# Patient Record
Sex: Female | Born: 1975
Health system: Southern US, Community
[De-identification: ages and names within clinical notes are randomized; demographics above are authoritative.]

## PROBLEM LIST (undated history)

## (undated) ENCOUNTER — Inpatient Hospital Stay (HOSPITAL_COMMUNITY): Payer: Self-pay

## (undated) DIAGNOSIS — O09529 Supervision of elderly multigravida, unspecified trimester: Secondary | ICD-10-CM

## (undated) DIAGNOSIS — R87619 Unspecified abnormal cytological findings in specimens from cervix uteri: Secondary | ICD-10-CM

## (undated) DIAGNOSIS — R87629 Unspecified abnormal cytological findings in specimens from vagina: Secondary | ICD-10-CM

## (undated) DIAGNOSIS — O24410 Gestational diabetes mellitus in pregnancy, diet controlled: Secondary | ICD-10-CM

## (undated) DIAGNOSIS — O99119 Other diseases of the blood and blood-forming organs and certain disorders involving the immune mechanism complicating pregnancy, unspecified trimester: Secondary | ICD-10-CM

## (undated) DIAGNOSIS — R011 Cardiac murmur, unspecified: Secondary | ICD-10-CM

## (undated) DIAGNOSIS — D696 Thrombocytopenia, unspecified: Secondary | ICD-10-CM

## (undated) DIAGNOSIS — R51 Headache: Secondary | ICD-10-CM

## (undated) DIAGNOSIS — R002 Palpitations: Secondary | ICD-10-CM

## (undated) DIAGNOSIS — R519 Headache, unspecified: Secondary | ICD-10-CM

## (undated) DIAGNOSIS — IMO0002 Reserved for concepts with insufficient information to code with codable children: Secondary | ICD-10-CM

## (undated) DIAGNOSIS — O24419 Gestational diabetes mellitus in pregnancy, unspecified control: Secondary | ICD-10-CM

## (undated) DIAGNOSIS — G8929 Other chronic pain: Secondary | ICD-10-CM

## (undated) DIAGNOSIS — Z3169 Encounter for other general counseling and advice on procreation: Secondary | ICD-10-CM

## (undated) DIAGNOSIS — O30043 Twin pregnancy, dichorionic/diamniotic, third trimester: Secondary | ICD-10-CM

## (undated) HISTORY — DX: Unspecified abnormal cytological findings in specimens from vagina: R87.629

## (undated) HISTORY — DX: Other diseases of the blood and blood-forming organs and certain disorders involving the immune mechanism complicating pregnancy, unspecified trimester: O99.119

## (undated) HISTORY — PX: OTHER SURGICAL HISTORY: SHX169

## (undated) HISTORY — DX: Gestational diabetes mellitus in pregnancy, unspecified control: O24.419

## (undated) HISTORY — PX: HYSTEROSCOPY: SHX211

## (undated) HISTORY — PX: WISDOM TOOTH EXTRACTION: SHX21

## (undated) HISTORY — DX: Other diseases of the blood and blood-forming organs and certain disorders involving the immune mechanism complicating pregnancy, unspecified trimester: D69.6

## (undated) HISTORY — DX: Palpitations: R00.2

## (undated) HISTORY — DX: Reserved for concepts with insufficient information to code with codable children: IMO0002

## (undated) HISTORY — DX: Supervision of elderly multigravida, unspecified trimester: O09.529

## (undated) HISTORY — DX: Unspecified abnormal cytological findings in specimens from cervix uteri: R87.619

## (undated) HISTORY — DX: Cardiac murmur, unspecified: R01.1

## (undated) HISTORY — DX: Encounter for other general counseling and advice on procreation: Z31.69

---

## 2007-09-24 ENCOUNTER — Emergency Department (HOSPITAL_COMMUNITY): Admission: EM | Admit: 2007-09-24 | Discharge: 2007-09-24 | Payer: Self-pay | Admitting: Emergency Medicine

## 2007-09-26 HISTORY — PX: COLPOSCOPY: SHX161

## 2008-02-20 ENCOUNTER — Encounter: Payer: Self-pay | Admitting: Family Medicine

## 2008-02-20 ENCOUNTER — Ambulatory Visit: Payer: Self-pay | Admitting: Family Medicine

## 2008-03-03 ENCOUNTER — Ambulatory Visit: Payer: Self-pay | Admitting: Family Medicine

## 2008-03-11 ENCOUNTER — Ambulatory Visit (HOSPITAL_COMMUNITY): Admission: RE | Admit: 2008-03-11 | Discharge: 2008-03-11 | Payer: Self-pay | Admitting: Obstetrics & Gynecology

## 2008-03-24 ENCOUNTER — Ambulatory Visit: Payer: Self-pay | Admitting: Family Medicine

## 2008-04-16 ENCOUNTER — Ambulatory Visit: Payer: Self-pay | Admitting: Nurse Practitioner

## 2009-02-16 ENCOUNTER — Ambulatory Visit: Payer: Self-pay | Admitting: Family Medicine

## 2009-02-16 DIAGNOSIS — S335XXA Sprain of ligaments of lumbar spine, initial encounter: Secondary | ICD-10-CM

## 2009-02-16 DIAGNOSIS — J309 Allergic rhinitis, unspecified: Secondary | ICD-10-CM | POA: Insufficient documentation

## 2009-02-16 DIAGNOSIS — R87619 Unspecified abnormal cytological findings in specimens from cervix uteri: Secondary | ICD-10-CM

## 2009-02-16 DIAGNOSIS — G43109 Migraine with aura, not intractable, without status migrainosus: Secondary | ICD-10-CM

## 2009-02-16 DIAGNOSIS — Z91010 Allergy to peanuts: Secondary | ICD-10-CM

## 2009-03-05 ENCOUNTER — Encounter (INDEPENDENT_AMBULATORY_CARE_PROVIDER_SITE_OTHER): Payer: Self-pay | Admitting: *Deleted

## 2009-03-05 ENCOUNTER — Ambulatory Visit: Payer: Self-pay | Admitting: Family Medicine

## 2009-03-08 LAB — CONVERTED CEMR LAB
AST: 19 units/L (ref 0–37)
Alkaline Phosphatase: 61 units/L (ref 39–117)
Bilirubin, Direct: 0.1 mg/dL (ref 0.0–0.3)
CO2: 27 meq/L (ref 19–32)
Calcium: 10.1 mg/dL (ref 8.4–10.5)
Chloride: 111 meq/L (ref 96–112)
HDL: 58.4 mg/dL (ref >39.00)
Potassium: 4.5 meq/L (ref 3.5–5.1)
Sodium: 140 meq/L (ref 135–145)
Total CHOL/HDL Ratio: 3
Total Protein: 7 g/dL (ref 6.0–8.3)

## 2009-05-19 ENCOUNTER — Ambulatory Visit: Payer: Self-pay | Admitting: Family Medicine

## 2009-05-19 DIAGNOSIS — S93409A Sprain of unspecified ligament of unspecified ankle, initial encounter: Secondary | ICD-10-CM | POA: Insufficient documentation

## 2009-06-03 ENCOUNTER — Ambulatory Visit: Payer: Self-pay | Admitting: Family Medicine

## 2009-06-08 ENCOUNTER — Ambulatory Visit: Payer: Self-pay | Admitting: Family Medicine

## 2009-07-12 ENCOUNTER — Ambulatory Visit: Payer: Self-pay | Admitting: Family Medicine

## 2009-08-24 ENCOUNTER — Telehealth: Payer: Self-pay | Admitting: Family Medicine

## 2009-09-21 ENCOUNTER — Ambulatory Visit: Payer: Self-pay | Admitting: Family Medicine

## 2009-09-21 DIAGNOSIS — B9789 Other viral agents as the cause of diseases classified elsewhere: Secondary | ICD-10-CM | POA: Insufficient documentation

## 2010-11-23 ENCOUNTER — Encounter (HOSPITAL_COMMUNITY)
Admission: RE | Admit: 2010-11-23 | Discharge: 2010-11-23 | Disposition: A | Discharge status: Home / Self Care | Payer: 59 | Source: Ambulatory Visit | Attending: Obstetrics and Gynecology | Admitting: Obstetrics and Gynecology

## 2010-11-23 LAB — CBC
Hemoglobin: 14.8 g/dL (ref 12.0–15.0)
MCH: 31 pg (ref 26.0–34.0)
MCHC: 34.6 g/dL (ref 30.0–36.0)

## 2010-11-25 ENCOUNTER — Ambulatory Visit (HOSPITAL_COMMUNITY)
Admission: RE | Admit: 2010-11-25 | Discharge: 2010-11-25 | Disposition: A | Discharge status: Home / Self Care | Payer: 59 | Source: Ambulatory Visit | Attending: Obstetrics and Gynecology | Admitting: Obstetrics and Gynecology

## 2010-11-25 ENCOUNTER — Other Ambulatory Visit: Payer: Self-pay | Admitting: Obstetrics and Gynecology

## 2010-11-25 DIAGNOSIS — R9389 Abnormal findings on diagnostic imaging of other specified body structures: Secondary | ICD-10-CM | POA: Insufficient documentation

## 2010-11-25 DIAGNOSIS — Z01812 Encounter for preprocedural laboratory examination: Secondary | ICD-10-CM | POA: Insufficient documentation

## 2010-11-25 DIAGNOSIS — N840 Polyp of corpus uteri: Secondary | ICD-10-CM | POA: Insufficient documentation

## 2010-11-25 LAB — PREGNANCY, URINE: Preg Test, Ur: NEGATIVE

## 2010-12-06 NOTE — Op Note (Signed)
  [name], [name]           ACCOUNT NO.:  [id number]  MEDICAL RECORD NO.:  [id number]           PATIENT TYPE:  O  LOCATION:  [facility]                          [facility]  PHYSICIAN:  Sherron Monday, MD        DATE OF BIRTH:  1976/06/03  DATE OF PROCEDURE:  11/25/2010 DATE OF DISCHARGE:                              OPERATIVE REPORT   PREOPERATIVE DIAGNOSIS:  Endometrial polyp on ultrasound.  POSTOPERATIVE DIAGNOSIS:  Endometrial polyp on ultrasound.  PROCEDURE:  Operative hysteroscopy, endometrial thickening/polyp in the anterior fundus.  SURGEON:  Sherron Monday, MD  ANESTHESIA:  General.  FINDINGS:  Small uterine cavity, normal bilateral ostia, endometrial thickening on the anterior aspect of the fundus.  SPECIMEN:  Endometrial polyp to pathology.  ESTIMATED BLOOD LOSS:  Minimal.  IV FLUIDS:  2150 mL.  URINE OUTPUT:  I and O cath at the start of the procedure.  COMPLICATIONS:  None.  DISPOSITION:  Stable to PACU. PROCEDURE:  After informed consent was reviewed with the patient including risks, benefits, and alternatives of the surgical procedure, she was transported to the OR, placed on the table in supine position. Anesthesia was induced and found to be adequate.  She was then placed in the [place] stirrups, prepped and draped in the normal sterile fashion.  Her bladder was sterilely drained.  Using an open-sided speculum, her cervix was easily visualized.  She was given a 16 mL 2% lidocaine paracervical block.  The cervix was dilated to accommodate the hysteroscope to approximately 21-French.  As the cervix was anterior, there was some difficulty to get into the cavity, but the cavity was inspected, normal bilateral ostia was seen, a small area of endometrial thickening likely polyp at the anterior uterine fundus was noted.  This was removed with graspers, otherwise the surgery was performed without complication.  The hysteroscope was removed and the tenaculum  was removed from the cervix, it was noted to be hemostatic.  The deficit was noted to be approximately 200 mL.  The patient tolerated the procedure well, was awakened in stable condition, transferred to the PACU.     Sherron Monday, MD     JB/MEDQ  D:  11/25/2010  T:  11/25/2010  Job:  [id number]  Electronically Signed by Sherron Monday MD on 12/06/2010 01:46:55 PM

## 2010-12-06 NOTE — H&P (Signed)
  [name], [name]           ACCOUNT NO.:  [id number]  MEDICAL RECORD NO.:  [id number]         PATIENT TYPE:  WAMB  LOCATION:                                [facility]  PHYSICIAN:  Sherron Monday, MD        DATE OF BIRTH:  December 17, 1975  DATE OF ADMISSION:  11/25/2010 DATE OF DISCHARGE:                             HISTORY & PHYSICAL   ADMISSION DIAGNOSIS:  Endometrial polyp on ultrasound.  PROCEDURE PLANNED:  Hysteroscopy, polypectomy, possible dilation and curettage.  HISTORY AND PHYSICAL:  A 35 year old G1, P0 with subfertility x3 years. She has had a normal HSG on an ultrasound during her workup.  It was noted that she had approximately a 1 cm x 1 cm fundally at the anterior wall.  She had normal bilateral ovaries.  She has had a lab evaluation to find causes of her infertility which was not diagnostic.  She also has had a progesterone evaluation after a cycle which revealed that she ovulated.  Her husband has a son from a previous relationship.  We recommended a semen analysis.  PAST MEDICAL HISTORY:  Significant for anxiety and migraines.  PAST SURGICAL HISTORY:  Not significant.  OB/GYN HISTORY:  She is a G1, P0-0-1-0 with G1 being a miscarriage.  She has a history of an abnormal Pap smear, this year was normal with high- risk HPV negative.  No sexually transmitted diseases.  She is sexually active and monogamous.  Cycles are fairly regular every 30 days.  No dyspareunia.  MEDICATIONS:  Singulair, Nasonex.  ALLERGIES:  SULFUR, FLOXIN, ZYRTEC and TREE NUTS.  SOCIAL HISTORY:  No tobacco, occasional alcohol, no drug use.  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  FAMILY HISTORY:  Significant for a sister and mother with thyroid disease, also mother has hypertension.  She has a aunt with breast cancer and an aunt with leukemia.  Grandmother with congestive heart failure.  PHYSICAL EXAMINATION:  VITAL SIGNS:  She is 5' 2, weighs 170 pounds, afebrile.  Vital signs  stable. GENERAL:  No apparent distress. CARDIOVASCULAR:  Regular rate and rhythm. LUNGS:  Clear to auscultation bilaterally. ABDOMEN:  Soft, nontender, nondistended.  Positive bowel sounds. EXTREMITIES:  Symmetric and nontender. PELVIC:  Exam reveals normal external female genitalia.  Normal Bartholin, urethral, and Skene glands.  Cervix and vagina are without lesions.  No cervical motion tenderness.  Uterus and adnexa are within normal limits.  No masses and nontender.  ASSESSMENT AND PLAN:  A 35 year old G1, P 0-0-1-0 for hysteroscopy, polypectomy.  Discussed with the patient and her husband risks, benefits, and alternatives of the procedure including, but not limited to bleeding, infection, damage to surrounding organs as well as causing scar tissue.  She voices understanding and wishes to proceed.     Sherron Monday, MD     JB/MEDQ  D:  11/24/2010  T:  11/24/2010  Job:  [id number]  Electronically Signed by Sherron Monday MD on 12/06/2010 01:46:22 PM

## 2011-02-07 NOTE — Group Therapy Note (Signed)
NAME:  [name], [name] NO.:  [id number]   MEDICAL RECORD NO.:  [id number]          PATIENT TYPE:  WOC   LOCATION:  [facility]                   FACILITY:  [facility]   PHYSICIAN:  Tinnie Gens, MD        DATE OF BIRTH:  02/13/1976   DATE OF SERVICE:                                  CLINIC NOTE   CHIEF COMPLAINT:  Yearly exam.   HISTORY OF PRESENT ILLNESS:  The patient is a questionably gravida 1,  para 0, who comes in today for annual exam.  She is without significant  complaint.  She is currently on no birth control, but she and her  husband live bicoastally.  They see each other about every 6 weeks.  She  has been on no birth control for the past 3 years, but they really have  not been together actively every month since they initially got married.  She has regular cycles every 31 to 38 days and no significant pain with  her period.   PAST MEDICAL HISTORY:  Significant for migraine headache.   PAST SURGICAL HISTORY:  Negative.   MEDICATIONS:  Maxalt p.r.n. migraines, Tylenol 1 to 3 p.o. as needed.   ALLERGIES:  SULFA, FLOXIN AND ZYRTEC.  She is not allergic to latex.   OBSTETRICAL HISTORY:  She is G1, P0.  She never had a positive pregnancy  test but did have symptoms of pregnancy and some heavy bleeding.  This  was not clearly documented.   GYNECOLOGIC HISTORY:  Menarche at age 41.  Cycles are monthly.  They  last for 6 days with medium flow.  She has no history of abnormal Pap.  No history of GC chlamydia or PID.   FAMILY HISTORY:  Significant for coronary artery disease, hypertension,  breast cancer in an aunt, and a grandmother who had blood clots.   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  She denies tobacco use.  Social alcohol user, 2 to 3 glasses of wine per  week.  No other drug use.   REVIEW OF SYSTEMS:  Fourteen-point review of systems was reviewed.  Please see GYN history in the chart.  Positive for muscle aches,  fatigue, nausea, vomiting at times, especially with taking vitamins.   PHYSICAL EXAMINATION:  VITAL SIGNS:  Vitals are as noted on the chart.  She had a blood pressure initially of 150/92 but a repeat of 122/82.  Pulse was 108.  Weight was 175.  GENERAL:  She is a well-developed, well-nourished female in no acute  distress.  HEENT:  Normocephalic, atraumatic.  Sclerae are anicteric.  NECK:  Supple.  Normal thyroid.  LUNGS:  Clear bilaterally.  CARDIOVASCULAR:  Regular rate and rhythm without rubs, gallops or  murmurs.  ABDOMEN:  Soft, nontender, nondistended.  EXTREMITIES:  No clubbing, cyanosis or edema.  There are 2+  distal  pulses.  BREASTS:  Symmetric with everted nipples.  No masses.  The left breast  has a little bit of pulling in of the nipple.  This is not new or  unchanged for her.  There is no supraclavicular or  axillary adenopathy.  GENITOURINARY:  Normal external female genitalia.  BUS was normal.  Vagina was pink and rugated.  Cervix was nulliparous without lesion.  Uterus was small, anteverted.  No adnexal mass or tenderness.  SKIN:  She has 2 moles on the chest that are somewhat enlarged with  irregular borders and difference in pigmentation.  She reports these are  unchanged.   IMPRESSION:  1. Annual exam.  2. Question of infertility, although it is not clear to me.  She      clearly meets criteria since she and her husband have not been      together and trying actively for 1 year; however, she has not been      using birth control for 3 years and has no significant pregnancies      to show for it.  No workup has been done as yet.   PLAN:  1. Pap smear today.  2. Follow up infertility workup, to include HSG, check hormone levels,      check thyroid and rule out diabetes.  If she desires she will call      [place] back and let us know about this.  Husband should also undergo      semen analysis testing; however, if they get in the same city for      long enough she  might achieve pregnancy without any of this      intervention.           ______________________________  Tinnie Gens, MD     TP/MEDQ  D:  02/20/2008  T:  02/20/2008  Job:  [id number]

## 2011-02-07 NOTE — Assessment & Plan Note (Signed)
[name], [name]           ACCOUNT NO.:  [id number]   MEDICAL RECORD NO.:  [id number]          PATIENT TYPE:  POB   LOCATION:  [facility]         FACILITY:  [facility]   PHYSICIAN:  Ginger Carne, MD DATE OF BIRTH:  1975/12/10   DATE OF SERVICE:  04/16/2008                                  CLINIC NOTE   The patient comes to the office today for consultation on her migraine  headaches.  The patient does admit to having had migraine headaches  since she was 35 years old.  She has a mother, grandmother, and sister  that have had migraines.  She has been formerly diagnosed with migraine  in the past.  She does not have an aura with her headaches.  She states  that these are located in her bilateral temple as well as occipital  regions.  She does have nausea and vomiting if she does not take her  medicine and is the headache comes on too strongly or last too long.  She is currently using Maxalt 1-2 tablets per month and Excedrin  approximately 8 tablets per month.  She is currently having 0-1 severe  headaches per month, 2-3 moderate headaches per month, and 8 mild  headaches per month.  Her triggers are weather, stress, and allergies to  outside plants that seem to last through the spring and summer.  She has  been on multiple meds for her allergies.  She is currently taking  Claritin-D.  She has a questionable allergy to [facility].   ALLERGIES:  SULFA, FLOXIN, and  ZYRTEC.   PHYSICAL EXAMINATION:  GENERAL:  A well-developed, well-nourished 35-  year-old Caucasian female in no acute distress.  VITAL SIGNS:  Blood pressure is 134/80, pulse is 93, weight is 178, and  height is 5 feet 8 inches.  HEENT:  Head is normocephalic and atraumatic.  Pupils are equal and  react to light.  CARDIAC:  Regular rate and rhythm.  No murmurs, rubs, or thrills.  LUNGS:  Clear bilaterally without rales, rhonchi, or wheezes.  NEUROLOGIC:  The patient is alert and oriented.  Her speech is  fluid.  There is no flight of ideas.  She has good muscle coordination and  strength.  She has no obvious neurological deficits.   ASSESSMENT/PLAN:  Her plan is to continue to use her Maxalt.  We did  have an approximately 40-minute discussion on using her medications  aggressively. She is clearly not using her Maxalt enough.  She will  switch from Excedrin Migraine over to Aleve to use for her milder  headaches and to add with the Maxalt.  We will renew her prescription  for Maxalt 10 mg 1 p.o. p.r.n. migraine, #12, with p.r.n. refills.  She  was also given a prescription for Phenergan to take 25 mg 1/2-1 tablet  q.6 h. p.r.n. migraine, #30, with 1 refill.  We will also have her  referred to an allergist.  Hopefully, she will get on a better regime  for her allergies and this will help her tremendously.  She does have  plans on becoming pregnant.  She is aware that if she becomes pregnant  she cannot take the Maxalt and  she is asked to return to the office for  a new plan.   The patient is informed of the results of her colposcopy.  She is asked  to return in 6 months for repeat Pap smear.      Remonia Richter, NP    ______________________________  Ginger Carne, MD    LR/MEDQ  D:  04/16/2008  T:  04/17/2008  Job:  [id number]

## 2011-07-19 ENCOUNTER — Telehealth: Payer: Self-pay | Admitting: *Deleted

## 2011-07-19 NOTE — Telephone Encounter (Signed)
Pt states she is starting the process for IVF and her doctors at [facility] center  are concerned about her taking maxalt.  They are asking if ok for her to take this while going through the process and while pregnant.  Please advise.

## 2011-07-20 NOTE — Telephone Encounter (Signed)
Notify pt that.. While activty trying to get pregnant or when pregnant.. maxalt is not a good option for migraine treatment...have her make appt to discuss migraines and other options to treat if happening frequently or not controlled with OTC tylenol (she should not use ibuprofen, ASA, naprosyn)

## 2011-07-20 NOTE — Telephone Encounter (Signed)
Patient notified as instructed by telephone. Tylenol is not helping. Pt cannot come right away. Pt scheduled appt with Dr Ermalene Searing on 08/10/11 at  3:45pm. Pt will call back sooner if h/a worsens.

## 2011-08-09 ENCOUNTER — Encounter: Payer: Self-pay | Admitting: Family Medicine

## 2011-08-10 ENCOUNTER — Encounter: Payer: Self-pay | Admitting: Family Medicine

## 2011-08-10 ENCOUNTER — Ambulatory Visit (INDEPENDENT_AMBULATORY_CARE_PROVIDER_SITE_OTHER): Payer: 59 | Admitting: Family Medicine

## 2011-08-10 VITALS — BP 110/70 | HR 117 | Temp 98.4°F | Ht 67.0 in | Wt 174.1 lb

## 2011-08-10 DIAGNOSIS — G43009 Migraine without aura, not intractable, without status migrainosus: Secondary | ICD-10-CM

## 2011-08-10 MED ORDER — ACETAMINOPHEN-CODEINE #3 300-30 MG PO TABS: 1.0000 | ORAL_TABLET | ORAL | Status: AC | PRN

## 2011-08-10 NOTE — Patient Instructions (Addendum)
Make sure to avoid triggers. Get 8 hours of sleep a night, eat regularly, good amount of protein, push fluids. Tylenol for pain, but if not improving may try tylenol #3. Limit as much as possible. Call if not taking care of acute migraine. If significant nausea, we can try/add metoclopramide. Call if migraines occuring more than 1-2 a week.

## 2011-08-10 NOTE — Assessment & Plan Note (Addendum)
Occuring frequently... Discussed healthy lifestyle and trigger avoidance. Will use tylenol for pain med, but if not responding will try tylenol # 3. Discussed in detail with pt options , class C status of Tylenol #3 and other options. She understands risk. She will limit use as much as possible.

## 2011-08-10 NOTE — Progress Notes (Signed)
  Subjective:    Patient ID: [name], female    DOB: Apr 13, 1976, 35 y.o.   MRN: [id number]  HPI  35 year old female with history of migraines presents after completing cycle of IVF. She is here to discuss migraine treatment during possible pregnancy.  In past using... maxalt ofr excedrine, but these are contraindicated in pregnancy. Pain over right eye, neck tension, mild nausea. Tylenol does not help her. Has been having migraines (trigger allergies) worse in spring and fall...occuring 3-4 a month. She is on singulair  and rhinocort. Some association with menses as well, or change in weather, no food triggers, some stress or fatigue trigger.     Review of Systems  Constitutional: Negative for fever and fatigue.  HENT: Negative for ear pain.   Eyes: Negative for pain.  Respiratory: Negative for chest tightness and shortness of breath.   Cardiovascular: Negative for chest pain, palpitations and leg swelling.  Gastrointestinal: Negative for abdominal pain.  Genitourinary: Negative for dysuria.  Neurological: Negative for syncope, weakness and numbness.       Objective:   Physical Exam  Constitutional: She is oriented to person, place, and time. She appears well-developed and well-nourished.  Neck: Normal range of motion. Neck supple.  Cardiovascular: Normal rate, regular rhythm, normal heart sounds and intact distal pulses.   Pulmonary/Chest: Effort normal and breath sounds normal.  Abdominal: Soft. Bowel sounds are normal.  Neurological: She is alert and oriented to person, place, and time. She displays normal reflexes. No cranial nerve deficit. She exhibits normal muscle tone. Coordination normal.  Psychiatric: She has a normal mood and affect. Her behavior is normal. Judgment and thought content normal.          Assessment & Plan:

## 2011-09-12 LAB — OB RESULTS CONSOLE GC/CHLAMYDIA
Chlamydia: NEGATIVE
Gonorrhea: NEGATIVE

## 2011-09-12 LAB — OB RESULTS CONSOLE RUBELLA ANTIBODY, IGM: Rubella: IMMUNE

## 2011-09-12 LAB — OB RESULTS CONSOLE HIV ANTIBODY (ROUTINE TESTING): HIV: NONREACTIVE

## 2011-09-26 NOTE — L&D Delivery Note (Signed)
Delivery Note Pt pushed well about one hour.  Had recurrent variable decels but they recovered between contractions and baby had good variability. At 3:49 PM a viable female was delivered via Vaginal, Spontaneous Delivery (Presentation: ; Occiput Anterior).  APGAR: 9, 9; weight pending.   Placenta status: Intact, Spontaneous.  Cord: 3 vessels with the following complications: None.   Anesthesia: Epidural  Episiotomy: None Lacerations: 2nd degree;Perineal Suture Repair: 3.0 vicryl rapide Est. Blood Loss (mL): 400cc  Mom to postpartum.  Baby to nursery-stable.  Oliver Pila 04/17/2012, 4:56 PM

## 2011-10-13 ENCOUNTER — Other Ambulatory Visit: Payer: Self-pay

## 2011-10-18 ENCOUNTER — Other Ambulatory Visit (HOSPITAL_COMMUNITY): Payer: Self-pay | Admitting: Obstetrics and Gynecology

## 2011-10-18 DIAGNOSIS — O289 Unspecified abnormal findings on antenatal screening of mother: Secondary | ICD-10-CM

## 2011-11-08 ENCOUNTER — Ambulatory Visit (HOSPITAL_COMMUNITY)
Admission: RE | Admit: 2011-11-08 | Discharge: 2011-11-08 | Disposition: A | Discharge status: Home / Self Care | Payer: 59 | Source: Ambulatory Visit | Attending: Obstetrics and Gynecology | Admitting: Obstetrics and Gynecology

## 2011-11-08 ENCOUNTER — Ambulatory Visit (HOSPITAL_COMMUNITY): Payer: 59

## 2011-11-08 ENCOUNTER — Encounter (HOSPITAL_COMMUNITY): Payer: 59

## 2011-11-08 ENCOUNTER — Encounter (HOSPITAL_COMMUNITY): Payer: Self-pay

## 2011-11-08 DIAGNOSIS — O09529 Supervision of elderly multigravida, unspecified trimester: Secondary | ICD-10-CM | POA: Insufficient documentation

## 2011-11-08 DIAGNOSIS — Z363 Encounter for antenatal screening for malformations: Secondary | ICD-10-CM | POA: Insufficient documentation

## 2011-11-08 DIAGNOSIS — O358XX0 Maternal care for other (suspected) fetal abnormality and damage, not applicable or unspecified: Secondary | ICD-10-CM | POA: Insufficient documentation

## 2011-11-08 DIAGNOSIS — O289 Unspecified abnormal findings on antenatal screening of mother: Secondary | ICD-10-CM

## 2011-11-08 DIAGNOSIS — Z1389 Encounter for screening for other disorder: Secondary | ICD-10-CM | POA: Insufficient documentation

## 2011-11-08 HISTORY — DX: Headache, unspecified: R51.9

## 2011-11-08 HISTORY — DX: Headache: R51

## 2011-11-08 HISTORY — DX: Other chronic pain: G89.29

## 2011-11-08 NOTE — Progress Notes (Signed)
Genetic Counseling  High-Risk Gestation Note  Appointment Date:  11/08/2011 Referred By: Kerby Nora, MD Date of Birth:  May 05, 1976 Partner:  [name]    Pregnancy History: G2P0010 Estimated Date of Delivery: 04/23/12 Estimated Gestational Age: [redacted]w[redacted]d Attending: Particia Nearing, MD   [name] and her husband, Mr. [name], were seen for genetic counseling regarding a maternal age of 29 and an increased risk for Down syndrome based on First trimester screening through [facility].  They were counseled regarding maternal age and the association with risk for chromosome conditions due to nondisjunction with aging of the ova.   We reviewed chromosomes, nondisjunction, and the associated 1 in 125 risk for fetal aneuploidy related to a maternal age of 47 at 16.[redacted] weeks gestation.  They were counseled that the risk for aneuploidy decreases as gestational age increases, accounting for those pregnancies which spontaneously abort.  We specifically discussed Down syndrome (trisomy 78), trisomies 56 and 50, and sex chromosome aneuploidies (47,XXX and 47,XXY) including the common features and prognoses of each.   We also reviewed [name]'s First trimester screening result and the associated increase in risk for fetal Down syndrome (1 in 206 to 1 in 110).  They were counseled regarding other explanations for a screen positive result including normal variation and differences in maternal metabolism.  In addition, we reviewed the screen adjusted reduction in risks for trisomy 18 (1 in 403 to 1 in 850).  They understand that First trimester screening provides a pregnancy specific risk for fetal aneuploidy, but is not considered to be diagnostic.  They were counseled regarding other available screening and diagnostic options including ultrasound, cell free fetal DNA testing, and amniocentesis.  The risks, benefits, and limitations of each of these options were reviewed in detail.  After thoughtful  consideration of these options, they elected to proceed with ultrasound.  A complete detailed ultrasound was performed today.  The ultrasound report will be documented separately.  In review, the ultrasound did not reveal any fetal anomalies or markers for fetal aneuploidy.  The patient was counseled regarding the 50% reduction in risk for Down syndrome, based on the normal ultrasound findings.  After further discussion, they declined the options of amniocentesis and cell free fetal DNA testing.  They understand that screening tests cannot rule out all birth defects or genetic syndromes.   [name] was provided with written information regarding cystic fibrosis (CF) including the carrier frequency and incidence in the Caucasian population, the availability of carrier testing and prenatal diagnosis if indicated.  In addition, we discussed that CF is routinely screened for as part of the [place] newborn screening panel.  She declined testing today.   Both family histories were reviewed and found to be noncontributory for birth defects, mental retardation, and known genetic conditions. Without further information regarding the provided family history, an accurate genetic risk cannot be calculated. Further genetic counseling is warranted if more information is obtained.  [name] denied exposure to environmental toxins or chemical agents. She denied the use of alcohol, tobacco or street drugs. She denied significant viral illnesses during the course of her pregnancy.   I counseled this couple regarding the above risks and available options.  The approximate face-to-face time with the genetic counselor was 42 minutes.    Donald Prose, MS Certified Genetic Counselor

## 2012-02-14 ENCOUNTER — Encounter: Payer: 59 | Attending: Obstetrics and Gynecology | Admitting: Dietician

## 2012-02-14 DIAGNOSIS — O99810 Abnormal glucose complicating pregnancy: Secondary | ICD-10-CM | POA: Insufficient documentation

## 2012-02-14 DIAGNOSIS — Z713 Dietary counseling and surveillance: Secondary | ICD-10-CM | POA: Insufficient documentation

## 2012-02-15 ENCOUNTER — Encounter: Payer: Self-pay | Admitting: Dietician

## 2012-02-15 NOTE — Progress Notes (Signed)
  Patient was seen on 02/14/2012 for Gestational Diabetes self-management class at the [facility]. The following learning objectives were met by the patient during this course:   States the definition of Gestational Diabetes  States why dietary management is important in controlling blood glucose  Describes the effects each nutrient has on blood glucose levels  Demonstrates ability to create a balanced meal plan  Demonstrates carbohydrate counting   States when to check blood glucose levels  Demonstrates proper blood glucose monitoring techniques  States the effect of stress and exercise on blood glucose levels  States the importance of limiting caffeine and abstaining from alcohol and smoking  Blood glucose monitor given: Accu-Chek SmartView Kit Lot # [id number] Exp: 03/24/2013 Blood glucose reading: 103 at 11:10 AM  Patient instructed to monitor glucose levels:Fasting and 1 hour after first bite of each meal FBS: 60 - <90 1 hour: <140   Patient received handouts:  Nutrition Diabetes and Pregnancy  Carbohydrate Counting List  Patient will be seen for follow-up as needed.

## 2012-03-08 ENCOUNTER — Ambulatory Visit (HOSPITAL_COMMUNITY)
Admission: RE | Admit: 2012-03-08 | Discharge: 2012-03-08 | Disposition: A | Discharge status: Home / Self Care | Payer: 59 | Source: Ambulatory Visit | Attending: Obstetrics and Gynecology | Admitting: Obstetrics and Gynecology

## 2012-03-08 DIAGNOSIS — M7989 Other specified soft tissue disorders: Secondary | ICD-10-CM | POA: Insufficient documentation

## 2012-03-08 NOTE — Progress Notes (Signed)
VASCULAR LAB PRELIMINARY  PRELIMINARY  PRELIMINARY  PRELIMINARY  Bilateral lower extremity venous duplex completed.    Preliminary report:  Bilateral:  No evidence of DVT, superficial thrombosis, or Baker's Cyst. EXAM COMPLETELY NEGATIVE.  Lashaunda Schild D,  RVS 03/08/2012, 10:15 AM

## 2012-04-11 ENCOUNTER — Encounter (HOSPITAL_COMMUNITY): Payer: Self-pay | Admitting: *Deleted

## 2012-04-11 ENCOUNTER — Telehealth (HOSPITAL_COMMUNITY): Payer: Self-pay | Admitting: *Deleted

## 2012-04-11 NOTE — Telephone Encounter (Signed)
Preadmission screen  

## 2012-04-12 ENCOUNTER — Encounter (HOSPITAL_COMMUNITY): Payer: Self-pay | Admitting: *Deleted

## 2012-04-12 ENCOUNTER — Telehealth (HOSPITAL_COMMUNITY): Payer: Self-pay | Admitting: *Deleted

## 2012-04-12 NOTE — Telephone Encounter (Signed)
Preadmission screen  

## 2012-04-13 ENCOUNTER — Inpatient Hospital Stay (HOSPITAL_COMMUNITY): Admission: RE | Admit: 2012-04-13 | Payer: 59 | Source: Ambulatory Visit

## 2012-04-17 ENCOUNTER — Encounter (HOSPITAL_COMMUNITY): Payer: Self-pay | Admitting: Anesthesiology

## 2012-04-17 ENCOUNTER — Inpatient Hospital Stay (HOSPITAL_COMMUNITY)
Admission: AD | Admit: 2012-04-17 | Discharge: 2012-04-19 | DRG: 775 | Disposition: A | Discharge status: Home / Self Care | Payer: 59 | Source: Ambulatory Visit | Attending: Obstetrics and Gynecology | Admitting: Obstetrics and Gynecology

## 2012-04-17 ENCOUNTER — Encounter (HOSPITAL_COMMUNITY): Payer: Self-pay | Admitting: *Deleted

## 2012-04-17 ENCOUNTER — Inpatient Hospital Stay (HOSPITAL_COMMUNITY): Payer: 59 | Admitting: Anesthesiology

## 2012-04-17 DIAGNOSIS — O09519 Supervision of elderly primigravida, unspecified trimester: Secondary | ICD-10-CM | POA: Diagnosis present

## 2012-04-17 DIAGNOSIS — O4290 Premature rupture of membranes, unspecified as to length of time between rupture and onset of labor, unspecified weeks of gestation: Principal | ICD-10-CM | POA: Diagnosis present

## 2012-04-17 LAB — CBC
HCT: 35.7 % — ABNORMAL LOW (ref 36.0–46.0)
Hemoglobin: 11.8 g/dL — ABNORMAL LOW (ref 12.0–15.0)
MCH: 32.2 pg (ref 26.0–34.0)
MCH: 32.3 pg (ref 26.0–34.0)
MCHC: 34.2 g/dL (ref 30.0–36.0)
MCHC: 34.6 g/dL (ref 30.0–36.0)
MCV: 94.2 fL (ref 78.0–100.0)
MCV: 93.4 fL (ref 78.0–100.0)
RDW: 14.2 % (ref 11.5–15.5)
WBC: 14.8 10*3/uL — ABNORMAL HIGH (ref 4.0–10.5)

## 2012-04-17 LAB — PREPARE RBC (CROSSMATCH)

## 2012-04-17 LAB — ABO/RH: ABO/RH(D): O POS

## 2012-04-17 MED ORDER — DIPHENHYDRAMINE HCL 25 MG PO CAPS: 25.0000 mg | ORAL_CAPSULE | Freq: Four times a day (QID) | ORAL | Status: DC | PRN

## 2012-04-17 MED ORDER — ONDANSETRON HCL 4 MG PO TABS: 4.0000 mg | ORAL_TABLET | ORAL | Status: DC | PRN

## 2012-04-17 MED ORDER — FENTANYL 2.5 MCG/ML BUPIVACAINE 1/10 % EPIDURAL INFUSION (WH - ANES)
INTRAMUSCULAR | Status: DC | PRN
  Administered 2012-04-17: 14 mL/h via EPIDURAL

## 2012-04-17 MED ORDER — DIPHENHYDRAMINE HCL 50 MG/ML IJ SOLN: 12.5000 mg | INTRAMUSCULAR | Status: DC | PRN

## 2012-04-17 MED ORDER — LIDOCAINE HCL (PF) 1 % IJ SOLN: 30.0000 mL | INTRAMUSCULAR | Status: DC | PRN

## 2012-04-17 MED ORDER — EPHEDRINE 5 MG/ML INJ: 10.0000 mg | INTRAVENOUS | Status: DC | PRN

## 2012-04-17 MED ORDER — EPHEDRINE 5 MG/ML INJ
10.0000 mg | INTRAVENOUS | Status: AC | PRN
  Administered 2012-04-17: 10 mg via INTRAVENOUS
  Administered 2012-04-17: 5 mg via INTRAVENOUS
  Filled 2012-04-17 (×2): qty 4

## 2012-04-17 MED ORDER — SENNOSIDES-DOCUSATE SODIUM 8.6-50 MG PO TABS
2.0000 | ORAL_TABLET | Freq: Every day | ORAL | Status: DC
  Administered 2012-04-17 – 2012-04-18 (×2): 2 via ORAL

## 2012-04-17 MED ORDER — ONDANSETRON HCL 4 MG/2ML IJ SOLN: 4.0000 mg | INTRAMUSCULAR | Status: DC | PRN

## 2012-04-17 MED ORDER — ZOLPIDEM TARTRATE 5 MG PO TABS: 5.0000 mg | ORAL_TABLET | Freq: Every evening | ORAL | Status: DC | PRN

## 2012-04-17 MED ORDER — IBUPROFEN 600 MG PO TABS
600.0000 mg | ORAL_TABLET | Freq: Four times a day (QID) | ORAL | Status: DC | PRN
  Administered 2012-04-17: 600 mg via ORAL
  Filled 2012-04-17: qty 1

## 2012-04-17 MED ORDER — OXYTOCIN BOLUS FROM INFUSION
250.0000 mL | Freq: Once | INTRAVENOUS | Status: DC
  Filled 2012-04-17: qty 500

## 2012-04-17 MED ORDER — PHENYLEPHRINE 40 MCG/ML (10ML) SYRINGE FOR IV PUSH (FOR BLOOD PRESSURE SUPPORT)
80.0000 ug | PREFILLED_SYRINGE | INTRAVENOUS | Status: DC | PRN
  Filled 2012-04-17: qty 5

## 2012-04-17 MED ORDER — LACTATED RINGERS IV SOLN
INTRAVENOUS | Status: DC
  Administered 2012-04-17: 500 mL via INTRAUTERINE

## 2012-04-17 MED ORDER — IBUPROFEN 600 MG PO TABS
600.0000 mg | ORAL_TABLET | Freq: Four times a day (QID) | ORAL | Status: DC
  Administered 2012-04-17 – 2012-04-19 (×6): 600 mg via ORAL
  Filled 2012-04-17 (×6): qty 1

## 2012-04-17 MED ORDER — OXYTOCIN 40 UNITS IN LACTATED RINGERS INFUSION - SIMPLE MED: 62.5000 mL/h | Freq: Once | INTRAVENOUS | Status: DC

## 2012-04-17 MED ORDER — LACTATED RINGERS IV SOLN
INTRAVENOUS | Status: DC
  Administered 2012-04-17 (×2): via INTRAVENOUS

## 2012-04-17 MED ORDER — LACTATED RINGERS IV SOLN
500.0000 mL | Freq: Once | INTRAVENOUS | Status: AC
  Administered 2012-04-17: 1000 mL via INTRAVENOUS

## 2012-04-17 MED ORDER — TERBUTALINE SULFATE 1 MG/ML IJ SOLN: 0.2500 mg | Freq: Once | INTRAMUSCULAR | Status: DC | PRN

## 2012-04-17 MED ORDER — LACTATED RINGERS IV SOLN: 500.0000 mL | INTRAVENOUS | Status: DC | PRN

## 2012-04-17 MED ORDER — BENZOCAINE-MENTHOL 20-0.5 % EX AERO
1.0000 "application " | INHALATION_SPRAY | CUTANEOUS | Status: DC | PRN
  Administered 2012-04-18: 1 via TOPICAL
  Filled 2012-04-17: qty 56

## 2012-04-17 MED ORDER — WITCH HAZEL-GLYCERIN EX PADS: 1.0000 "application " | MEDICATED_PAD | CUTANEOUS | Status: DC | PRN

## 2012-04-17 MED ORDER — OXYTOCIN 40 UNITS IN LACTATED RINGERS INFUSION - SIMPLE MED: 1.0000 m[IU]/min | INTRAVENOUS | Status: DC

## 2012-04-17 MED ORDER — OXYCODONE-ACETAMINOPHEN 5-325 MG PO TABS
1.0000 | ORAL_TABLET | ORAL | Status: DC | PRN
  Administered 2012-04-17 (×2): 1 via ORAL
  Administered 2012-04-18 (×2): 2 via ORAL
  Administered 2012-04-18: 1 via ORAL
  Administered 2012-04-18 – 2012-04-19 (×4): 2 via ORAL
  Administered 2012-04-19: 1 via ORAL
  Filled 2012-04-17 (×2): qty 2
  Filled 2012-04-17: qty 1
  Filled 2012-04-17: qty 2
  Filled 2012-04-17 (×2): qty 1
  Filled 2012-04-17 (×4): qty 2

## 2012-04-17 MED ORDER — PRENATAL MULTIVITAMIN CH
1.0000 | ORAL_TABLET | Freq: Every day | ORAL | Status: DC
  Administered 2012-04-17 – 2012-04-18 (×2): 1 via ORAL
  Filled 2012-04-17 (×2): qty 1

## 2012-04-17 MED ORDER — SIMETHICONE 80 MG PO CHEW: 80.0000 mg | CHEWABLE_TABLET | ORAL | Status: DC | PRN

## 2012-04-17 MED ORDER — MONTELUKAST SODIUM 10 MG PO TABS
10.0000 mg | ORAL_TABLET | Freq: Every day | ORAL | Status: DC
  Administered 2012-04-17 – 2012-04-18 (×2): 10 mg via ORAL
  Filled 2012-04-17 (×2): qty 1

## 2012-04-17 MED ORDER — FLEET ENEMA 7-19 GM/118ML RE ENEM: 1.0000 | ENEMA | RECTAL | Status: DC | PRN

## 2012-04-17 MED ORDER — CITRIC ACID-SODIUM CITRATE 334-500 MG/5ML PO SOLN
30.0000 mL | ORAL | Status: DC | PRN
  Administered 2012-04-17 (×2): 30 mL via ORAL
  Filled 2012-04-17 (×2): qty 15

## 2012-04-17 MED ORDER — TETANUS-DIPHTH-ACELL PERTUSSIS 5-2.5-18.5 LF-MCG/0.5 IM SUSP: 0.5000 mL | Freq: Once | INTRAMUSCULAR | Status: DC

## 2012-04-17 MED ORDER — DIBUCAINE 1 % RE OINT: 1.0000 "application " | TOPICAL_OINTMENT | RECTAL | Status: DC | PRN

## 2012-04-17 MED ORDER — OXYTOCIN 40 UNITS IN LACTATED RINGERS INFUSION - SIMPLE MED
1.0000 m[IU]/min | INTRAVENOUS | Status: DC
  Administered 2012-04-17: 1 m[IU]/min via INTRAVENOUS
  Filled 2012-04-17: qty 1000

## 2012-04-17 MED ORDER — ONDANSETRON HCL 4 MG/2ML IJ SOLN: 4.0000 mg | Freq: Four times a day (QID) | INTRAMUSCULAR | Status: DC | PRN

## 2012-04-17 MED ORDER — PHENYLEPHRINE 40 MCG/ML (10ML) SYRINGE FOR IV PUSH (FOR BLOOD PRESSURE SUPPORT): 80.0000 ug | PREFILLED_SYRINGE | INTRAVENOUS | Status: DC | PRN

## 2012-04-17 MED ORDER — OXYCODONE-ACETAMINOPHEN 5-325 MG PO TABS: 1.0000 | ORAL_TABLET | ORAL | Status: DC | PRN

## 2012-04-17 MED ORDER — LANOLIN HYDROUS EX OINT: TOPICAL_OINTMENT | CUTANEOUS | Status: DC | PRN

## 2012-04-17 MED ORDER — FENTANYL 2.5 MCG/ML BUPIVACAINE 1/10 % EPIDURAL INFUSION (WH - ANES)
14.0000 mL/h | INTRAMUSCULAR | Status: DC
Start: 2012-04-17 — End: 2012-04-17
  Administered 2012-04-17 (×2): 14 mL/h via EPIDURAL
  Filled 2012-04-17 (×3): qty 60

## 2012-04-17 MED ORDER — ACETAMINOPHEN 325 MG PO TABS: 650.0000 mg | ORAL_TABLET | ORAL | Status: DC | PRN

## 2012-04-17 MED ORDER — LIDOCAINE HCL (PF) 1 % IJ SOLN
INTRAMUSCULAR | Status: DC | PRN
  Administered 2012-04-17 (×2): 4 mL

## 2012-04-17 NOTE — Progress Notes (Signed)
Patient ID: [name], female   DOB: 03-01-1976, 36 y.o.   MRN: [id number] FHR currently much improved.  130 baseline with occasional intermittent variable decels, now mild.  Great variability, some accels Will restart pitocin and achieve adequate contractions D/w pt and husband will intervene if FHR shows intolerance of labor.

## 2012-04-17 NOTE — Progress Notes (Signed)
Patient ID: [name], female   DOB: 05-19-1976, 36 y.o.   MRN: [id number] CTSP for decels  Pt noted to have FHR decels to 80 with ctx  Baseline 120-130  +scalp stim Position change and O2 have not relieved.  Pitocin off IUPC placed and will try amnioinfusion 80/4/-1 Dr. Ellyn Hack to watch tracing while I do a c-section

## 2012-04-17 NOTE — Progress Notes (Signed)
Patient ID: [name], female   DOB: [birth year], 36 y.o.   MRN: [id number] Comfortable with epidural.  D/w pt variables severe and mild, amnioinfusion.  D/W pt possible need for LTCS for delivery, d/w pt and husband r/b/a including but not limited to bleeding, infection, damage to surrounding organs, trouble healing and injury to baby.   AF VSS gen NAD FHTs 130-140's, with variables with ctxs, were severe now mild, positive scalp stimulation.  Great beat to beat variability toco q 2-3 min SVE 4, 4-5/70/0/-1  36yo G2P0010 at 39+ in labor, will restart pitocin slowly.  Close monitoring

## 2012-04-17 NOTE — MAU Note (Signed)
Pt to 166 for eval of ROM

## 2012-04-17 NOTE — Progress Notes (Signed)
Spoke with dr. Senaida Ores - aware of decels and fhr - and that pit was turned off - also let her know that we have tried every position and bolused pt and that pt is on O2 nrb - stated that she was on her way in - will continue to monitor

## 2012-04-17 NOTE — Anesthesia Procedure Notes (Signed)
Epidural Patient location during procedure: OB Start time: 04/17/2012 6:00 AM  Staffing Anesthesiologist: Shadeed Colberg A. Performed by: anesthesiologist   Preanesthetic Checklist Completed: patient identified, site marked, surgical consent, pre-op evaluation, timeout performed, IV checked, risks and benefits discussed and monitors and equipment checked  Epidural Patient position: sitting Prep: site prepped and draped and DuraPrep Patient monitoring: continuous pulse ox and blood pressure Approach: midline Injection technique: LOR air  Needle:  Needle type: Tuohy  Needle gauge: 17 G Needle length: 9 cm Needle insertion depth: 5 cm cm Catheter type: closed end flexible Catheter size: 19 Gauge Catheter at skin depth: 10 cm Test dose: negative and Other  Assessment Events: blood not aspirated, injection not painful, no injection resistance, negative IV test and no paresthesia  Additional Notes Patient identified. Risks and benefits discussed including failed block, incomplete  Pain control, post dural puncture headache, nerve damage, paralysis, blood pressure Changes, nausea, vomiting, reactions to medications-both toxic and allergic and post Partum back pain. All questions were answered. Patient expressed understanding and wished to proceed. Sterile technique was used throughout procedure. Epidural site was Dressed with sterile barrier dressing. No paresthesias, signs of intravascular injection Or signs of intrathecal spread were encountered.  Patient was more comfortable after the epidural was dosed. Please see RN's note for documentation of vital signs and FHR which are stable.

## 2012-04-17 NOTE — Progress Notes (Signed)
Patient ID: [name], female   DOB: Dec 02, 1975, 36 y.o.   MRN: [id number] Pt reached 10cm and began pushing at 240pm.  Making progress with caput visible at introitus.  FHR with repetitive variable decels, but recovers b/w contractions.   Good variability.  Will follow closely and have d/w pt may intervene with vacuum if FHR has more of a delay in recovery.

## 2012-04-17 NOTE — H&P (Signed)
[name], PANDEY           ACCOUNT NO.:  [id number]  MEDICAL RECORD NO.:  [id number]  LOCATION:  9166                          FACILITY:  [facility]  PHYSICIAN:  Malachi Pro. Ambrose Mantle, M.D. DATE OF BIRTH:  10-05-1975  DATE OF ADMISSION:  04/17/2012 DATE OF DISCHARGE:                             HISTORY & PHYSICAL   PRESENT ILLNESS:  This is a 36 year old white female, para 0-0-1-0, gravida 2 with EDC of April 23, 2012, by her last period, and an 8-week ultrasound confirmed her dates.  The patient had IVF at [facility] on August 03, 2011.  Her prenatal care was essentially uncomplicated. Blood group and type O positive, negative antibody, rubella immune, RPR nonreactive, urine culture negative.  Hepatitis B surface antigen negative, HIV negative, GC and Chlamydia negative.  First trimester screen showed an increased risk of Down syndrome.  AFP was negative. One-hour Glucola was 141, 3-hour GTT 67, 177, 196, and 140.  HIV and RPR were negative and nonreactive respectively.  Group B strep was negative. The patient's physician, Dr. Ellyn Hack, wanted her to have repeat the 3- hour GTT, but she chose to proceed without repeating it to treat herself as a gestational diabetic.  The patient began leaking fluid at home in the evening of April 16, 2012, and came to Maternity Admission, she was brought to Labor and Delivery, where rupture of membranes was confirmed. She is contracting irregularly and will be admitted and started on Pitocin.  PAST MEDICAL HISTORY:  Allergies:  SULFA, FLOXIN, TREE NUTS, and ZYRTEC. Medical History:  She has had history of allergies, anxiety and depression, endometrial polyp in March 2012, frequent UTIs, gestational diabetes, migraines, and subfertility. Surgical History:  The patient had hysteroscopy in 2012, with resection of a polyp and wisdom teeth extracted at age 38.  FAMILY HISTORY:  The mother has arthritis, depression, hypertension, hypothyroidism, migraines,  multiple miscarriages, and thyroid disease. A sister has migraines, thyroid disease, hypothyroidism, and second- degree relatives with other problems.  OBSTETRIC HISTORY:  In February 2007, the patient had a spontaneous abortion.  The patient's target ultrasound apparently showed no other abnormalities that would suggest Down syndrome.  PHYSICAL EXAMINATION:  VITAL SIGNS:  On admission, temperature 98.4, pulse 106, respirations 18, blood pressure 109/69. HEART:  Normal size and sounds.  No murmurs. LUNGS:  Clear to auscultation. ABDOMEN:  Soft.  Fundal height at her last prenatal visit was 38 cm. Fetal heart tones are normal.  Cervix per the nurse's exam is 3 cm, 80%, vertex at -1, and rupture of membranes is confirmed with the fern test.  ADMITTING IMPRESSION:  Intrauterine pregnancy at 39 weeks and 1 day, with premature rupture of the membranes.  The patient is admitted for Pitocin induction.     Malachi Pro. Ambrose Mantle, M.D.     TFH/MEDQ  D:  04/17/2012  T:  04/17/2012  Job:  [id number]

## 2012-04-17 NOTE — MAU Note (Signed)
Pt reports leaking fluids since 1830, contractions now qbout 5-6 minutes apart

## 2012-04-17 NOTE — Anesthesia Preprocedure Evaluation (Addendum)
Anesthesia Evaluation  Patient identified by MRN, date of birth, ID band Patient awake    Reviewed: Allergy & Precautions, H&P , Patient's Chart, lab work & pertinent test results  Airway Mallampati: III TM Distance: >3 FB Neck ROM: full    Dental No notable dental hx. (+) Teeth Intact   Pulmonary neg pulmonary ROS,  breath sounds clear to auscultation  Pulmonary exam normal       Cardiovascular negative cardio ROS  Rhythm:regular Rate:Normal     Neuro/Psych  Headaches, negative psych ROS   GI/Hepatic negative GI ROS, Neg liver ROS,   Endo/Other  negative endocrine ROSWell Controlled, Gestational  Renal/GU negative Renal ROS  negative genitourinary   Musculoskeletal   Abdominal Normal abdominal exam  (+)   Peds  Hematology Thrombocytopenia Last platelet count 93k. Had thrombocytopenia prior to pregnancy   Anesthesia Other Findings   Reproductive/Obstetrics (+) Pregnancy                          Anesthesia Physical Anesthesia Plan  ASA: II  Anesthesia Plan: Epidural   Post-op Pain Management:    Induction:   Airway Management Planned:   Additional Equipment:   Intra-op Plan:   Post-operative Plan:   Informed Consent: I have reviewed the patients History and Physical, chart, labs and discussed the procedure including the risks, benefits and alternatives for the proposed anesthesia with the patient or authorized representative who has indicated his/her understanding and acceptance.     Plan Discussed with: Anesthesiologist  Anesthesia Plan Comments:         Anesthesia Quick Evaluation

## 2012-04-17 NOTE — Progress Notes (Signed)
Patient ID: [name], female   DOB: 12-26-1975, 36 y.o.   MRN: [id number] The pitocin is at 5 mu/ minute and her contractions are still 2-4 minutes apart. She is having decelerations with contractions but variability is good and there is good recovery. Will continue to observe for now.

## 2012-04-17 NOTE — Progress Notes (Signed)
Patient ID: [name], female   DOB: 1976-07-08, 36 y.o.   MRN: [id number] Pt comfortable with epidural  C/9/0-+1  FHR currently reassuring occasional variable decel Will push at 10 cm and low station

## 2012-04-18 ENCOUNTER — Inpatient Hospital Stay (HOSPITAL_COMMUNITY): Admission: RE | Admit: 2012-04-18 | Payer: 59 | Source: Ambulatory Visit

## 2012-04-18 LAB — CBC
MCHC: 34.1 g/dL (ref 30.0–36.0)
Platelets: 72 10*3/uL — ABNORMAL LOW (ref 150–400)
RDW: 14.2 % (ref 11.5–15.5)

## 2012-04-18 NOTE — Anesthesia Postprocedure Evaluation (Signed)
  Anesthesia Post-op Note  Patient: [name]  Procedure(s) Performed: * No procedures listed *  Patient Location: Mother/Baby  Anesthesia Type: Epidural  Level of Consciousness: awake  Airway and Oxygen Therapy: Patient Spontanous Breathing  Post-op Pain: none  Post-op Assessment: Patient's Cardiovascular Status Stable, Respiratory Function Stable, Patent Airway, No signs of Nausea or vomiting, Adequate PO intake, Pain level controlled, No headache, No backache, No residual numbness and No residual motor weakness  Post-op Vital Signs: Reviewed and stable  Complications: No apparent anesthesia complications

## 2012-04-18 NOTE — Progress Notes (Signed)
Post Partum Day 1 Subjective: no complaints, voiding and tolerating PO  Objective: Blood pressure 83/52, pulse 66, temperature 97.7 F (36.5 C), temperature source Oral, resp. rate 14, height 5\' 7"  (1.702 m), weight 88.905 kg (196 lb), last menstrual period 07/18/2011, SpO2 97.00%, unknown if currently breastfeeding.  Physical Exam:  General: alert and cooperative Lochia: appropriate Uterine Fundus: firm I Basename 04/18/12 0515 04/17/12 1658  HGB 10.6* 11.8*  HCT 31.1* 34.1*    Assessment/Plan: Plan for discharge tomorrow   LOS: 1 day   Kleigh Hoelzer W 04/18/2012, 9:20 AM

## 2012-04-19 ENCOUNTER — Encounter (HOSPITAL_COMMUNITY): Payer: Self-pay | Admitting: Obstetrics and Gynecology

## 2012-04-19 MED ORDER — OXYCODONE-ACETAMINOPHEN 5-325 MG PO TABS: 1.0000 | ORAL_TABLET | Freq: Four times a day (QID) | ORAL | Status: AC | PRN

## 2012-04-19 MED ORDER — PRENATAL MULTIVITAMIN CH: 1.0000 | ORAL_TABLET | Freq: Every day | ORAL | Status: DC

## 2012-04-19 MED ORDER — IBUPROFEN 800 MG PO TABS: 800.0000 mg | ORAL_TABLET | Freq: Three times a day (TID) | ORAL | Status: AC | PRN

## 2012-04-19 NOTE — Progress Notes (Signed)
Post Partum Day 2 Subjective: no complaints, voiding, tolerating PO and nl lochia, pain controlled  Objective: Blood pressure 95/62, pulse 71, temperature 97.4 F (36.3 C), temperature source Oral, resp. rate 18, height 5\' 7"  (1.702 m), weight 88.905 kg (196 lb), last menstrual period 07/18/2011, SpO2 97.00%, unknown if currently breastfeeding.  Physical Exam:  General: alert and no distress Lochia: appropriate Uterine Fundus: firm   Basename 04/18/12 0515 04/17/12 1658  HGB 10.6* 11.8*  HCT 31.1* 34.1*    Assessment/Plan: Discharge home, Breastfeeding and Lactation consult.  D/C with motrin/ percocet/pnv; f/u 6 weeks   LOS: 2 days   BOVARD,Ladainian Therien 04/19/2012, 7:54 AM

## 2012-04-19 NOTE — Discharge Summary (Signed)
Obstetric Discharge Summary Reason for Admission: onset of labor Prenatal Procedures: none Intrapartum Procedures: spontaneous vaginal delivery Postpartum Procedures: none Complications-Operative and Postpartum: 2nd degree perineal laceration Hemoglobin  Date Value Range Status  04/18/2012 10.6* 12.0 - 15.0 g/dL Final     HCT  Date Value Range Status  04/18/2012 31.1* 36.0 - 46.0 % Final    Physical Exam:  General: alert and no distress Lochia: appropriate Uterine Fundus: firm   Discharge Diagnoses: Term Pregnancy-delivered  Discharge Information: Date: 04/19/2012 Activity: pelvic rest Diet: routine Medications: PNV, Ibuprofen and Percocet Condition: stable Instructions: refer to practice specific booklet Discharge to: home Follow-up Information    Follow up with Oliver Pila, MD. Schedule an appointment as soon as possible for a visit in 6 weeks.   Contact information:   [address] [place] [address] [phone]          Newborn Data: Live born female  Birth Weight: 7 lb 5.1 oz (3320 g) APGAR: 9, 9  Home with mother.  [name],[name] 04/19/2012, 8:46 AM

## 2012-04-21 LAB — TYPE AND SCREEN
ABO/RH(D): O POS
Antibody Screen: NEGATIVE
Unit division: 0
Unit division: 0

## 2013-09-25 NOTE — L&D Delivery Note (Addendum)
Delivery Note   [name], [name] [name] [[id number]]  At 3:56 PM a viable and healthy female was delivered via  (Presentation: OA with left hand Compound presentation;ROT  ).  APGAR: P; weight P.   Placenta status: delivered, manually extracted .  Cord: 3c with the following complications: none .  Anesthesia: epidural   Episiotomy: none Lacerations: 2nd degree Suture Repair: 3.0 vicryl rapide Est. Blood Loss (mL): 800cc    [name], [name] [[id number]]  At 4:00 PM a viable and healthy female was delivered via  (Presentation: double footling breech ).  APGAR: P; weight P.   Placenta status: delivered manually extraction, .  Cord:  3 VC with the following complications: none.  After delivery of placenta - [name] currettage of uterus, yielding small tissue, will cover with cefotetan  PP hemorrhage of 500cc, CBC checked Hgb 9.4.  2nd IV started, cytotec PR PH = 7.24 Mom to postpartum.   Baby A to Couplet care / Skin to Skin.   Baby B to Couplet care / Skin to Skin.  [name], [name] 07/23/2014, 5:15 PM   Br/Bo; O+; Contra none: Tdap in [facility], Flu in [facility].  [place]  Circumcision for female infant d/w parents r/b/a - wish to proceed

## 2014-01-06 LAB — US OB COMP LESS 14 WKS

## 2014-01-09 LAB — US OB COMP LESS 14 WKS

## 2014-01-21 LAB — OB RESULTS CONSOLE RUBELLA ANTIBODY, IGM: RUBELLA: IMMUNE

## 2014-01-21 LAB — OB RESULTS CONSOLE RPR: RPR: NONREACTIVE

## 2014-01-21 LAB — OB RESULTS CONSOLE GC/CHLAMYDIA
Chlamydia: NEGATIVE
Gonorrhea: NEGATIVE

## 2014-01-21 LAB — OB RESULTS CONSOLE HIV ANTIBODY (ROUTINE TESTING): HIV: NONREACTIVE

## 2014-01-21 LAB — OB RESULTS CONSOLE ABO/RH: RH TYPE: POSITIVE

## 2014-01-21 LAB — OB RESULTS CONSOLE ANTIBODY SCREEN: Antibody Screen: NEGATIVE

## 2014-01-21 LAB — OB RESULTS CONSOLE HEPATITIS B SURFACE ANTIGEN: Hepatitis B Surface Ag: NEGATIVE

## 2014-01-26 LAB — US OB COMP LESS 14 WKS

## 2014-01-27 ENCOUNTER — Other Ambulatory Visit (HOSPITAL_COMMUNITY): Payer: Self-pay | Admitting: Obstetrics and Gynecology

## 2014-01-27 DIAGNOSIS — IMO0002 Reserved for concepts with insufficient information to code with codable children: Secondary | ICD-10-CM

## 2014-01-27 DIAGNOSIS — Z0489 Encounter for examination and observation for other specified reasons: Secondary | ICD-10-CM

## 2014-02-02 ENCOUNTER — Other Ambulatory Visit (HOSPITAL_COMMUNITY): Payer: Self-pay | Admitting: Obstetrics and Gynecology

## 2014-02-02 DIAGNOSIS — Z0489 Encounter for examination and observation for other specified reasons: Secondary | ICD-10-CM

## 2014-02-02 DIAGNOSIS — IMO0002 Reserved for concepts with insufficient information to code with codable children: Secondary | ICD-10-CM

## 2014-02-02 DIAGNOSIS — IMO0001 Reserved for inherently not codable concepts without codable children: Secondary | ICD-10-CM

## 2014-02-26 ENCOUNTER — Ambulatory Visit (HOSPITAL_COMMUNITY)
Admission: RE | Admit: 2014-02-26 | Discharge: 2014-02-26 | Disposition: A | Discharge status: Home / Self Care | Payer: 59 | Source: Ambulatory Visit | Attending: Obstetrics and Gynecology | Admitting: Obstetrics and Gynecology

## 2014-02-26 ENCOUNTER — Encounter (HOSPITAL_COMMUNITY): Payer: Self-pay

## 2014-02-26 VITALS — BP 126/82 | HR 120 | Wt 190.5 lb

## 2014-02-26 DIAGNOSIS — O30009 Twin pregnancy, unspecified number of placenta and unspecified number of amniotic sacs, unspecified trimester: Secondary | ICD-10-CM | POA: Insufficient documentation

## 2014-02-26 DIAGNOSIS — O351XX0 Maternal care for (suspected) chromosomal abnormality in fetus, not applicable or unspecified: Secondary | ICD-10-CM | POA: Insufficient documentation

## 2014-02-26 DIAGNOSIS — O30049 Twin pregnancy, dichorionic/diamniotic, unspecified trimester: Secondary | ICD-10-CM | POA: Insufficient documentation

## 2014-02-26 DIAGNOSIS — IMO0001 Reserved for inherently not codable concepts without codable children: Secondary | ICD-10-CM

## 2014-02-26 DIAGNOSIS — O3510X0 Maternal care for (suspected) chromosomal abnormality in fetus, unspecified, not applicable or unspecified: Secondary | ICD-10-CM | POA: Insufficient documentation

## 2014-02-26 DIAGNOSIS — O09529 Supervision of elderly multigravida, unspecified trimester: Secondary | ICD-10-CM | POA: Insufficient documentation

## 2014-02-26 DIAGNOSIS — Z0489 Encounter for examination and observation for other specified reasons: Secondary | ICD-10-CM

## 2014-02-26 DIAGNOSIS — IMO0002 Reserved for concepts with insufficient information to code with codable children: Secondary | ICD-10-CM | POA: Insufficient documentation

## 2014-02-26 LAB — US OB DETAIL + 14 WK

## 2014-02-26 NOTE — Progress Notes (Signed)
Genetic Counseling  High-Risk Gestation Note  Appointment Date:  02/26/2014 Referred By: Janyth Contes, * Date of Birth:  1976-02-21 Partner:  Dina Rich    Pregnancy History: [id number] Estimated Date of Delivery: 08/07/14 Estimated Gestational Age: 10w6dAttending: JJolyn Lent MD    Mrs. CFrederik Schmidtand her husband, Mr. TKamilya Wakeman were seen for genetic counseling regarding a maternal age of 38y.o. and an increased risk for Down syndrome [place] dichorionic/diamniotic [place].  They were counseled regarding maternal age and the association with risk for chromosome conditions due to nondisjunction with aging of the ova.  The current pregnancy was conceived with frozen embryos. The patient was 38years old at the time of egg retrieval.  We reviewed chromosomes, nondisjunction, and the associated 1 in 141 risk for fetal aneuploidy related to a maternal age of 38y.o. (age at egg retrieval) at 136w6destation.  They were counseled that the risk for aneuploidy decreases as gestational age increases, accounting for those pregnancies which spontaneously abort.  We specifically discussed Down syndrome (trisomy 2168 trisomies 1384nd 188and sex chromosome aneuploidies (47,XXX and 47,XXY) including the common features and prognoses of each.   We reviewed [name]'s First trimester screen result and the associated increase in risk for fetal Down syndrome (1 in 106 to 1 in 48). We discussed that 1 in 4832s likely an overestimate, given that the a priori risk used was the patient's current age at delivery and not the age at the time of egg retrieval. We planned to contact the lab and have the result recalculated using this information. However, we discussed that the result will still be expected to be screen positive, given that it will likely still be higher than the a priori risk.  They were counseled regarding other explanations  for a screen positive result including normal variation and differences in maternal metabolism.  We reviewed that sensitivity and specificity are reduced in a twin gestation and that the risk provided is not specific to one twin. They understand that Quad screening provides a pregnancy specific risk for Down syndrome, but is not considered to be diagnostic.    We reviewed other available screening options including noninvasive prenatal screening (NIPS)/cell free DNA testing and detailed ultrasound. They were counseled that screening tests are used to modify a patient's a priori risk for aneuploidy, typically based on age. This estimate provides a pregnancy specific risk assessment. We reviewed the benefits and limitations of each option. Specifically, we discussed the conditions for which each test screens, the detection rates, and false positive rates of each. The sensitivity and specificity of NIPS is reduced in a twin gestation. They were also counseled regarding diagnostic testing via amniocentesis. We reviewed the approximate 1 in 30771-165isk for complications for amniocentesis, including spontaneous pregnancy loss. After consideration of all the options, they declined NIPS and amniocentesis. They elected to proceed with targeted ultrasound only.   A complete ultrasound was performed today. The ultrasound report will be sent under separate cover. There were no visualized fetal anomalies or markers suggestive of aneuploidy. Fetal anatomic survey was not completed due to twin gestation and fetal position. Follow-up ultrasound was planned for 03/26/14. Diagnostic testing was declined today.  They understand that screening tests cannot rule out all birth defects or genetic syndromes. The patient was advised of this limitation and states she still does not want additional testing at this time.   [name] was  provided with written information regarding cystic fibrosis (CF) including the carrier frequency  and incidence in the Caucasian population, the availability of carrier testing and prenatal diagnosis if indicated.  In addition, we discussed that CF is routinely screened for as part of the [place] newborn screening panel.  She previously had CF carrier screening in 2012, which was reportedly negative for the mutations screened. Given this report, her risk to be a CF carrier has been reduced.   Both family histories were reviewed and updated since the patient's 2013 genetic counseling visit. The family histories are noncontributory for updates regarding birth defects, intellectual disability, and known genetic conditions. Without further information regarding the provided family history, an accurate genetic risk cannot be calculated. Further genetic counseling is warranted if more information is obtained.  [name] denied exposure to environmental toxins or chemical agents. She denied the use of alcohol, tobacco or street drugs. She denied significant viral illnesses during the course of her pregnancy. Her medical and surgical histories were noncontributory.     I counseled this couple regarding the above risks and available options.  The approximate face-to-face time with the genetic counselor was 25 minutes.    Kandra Nicolas Gildardo Griffes, MS,  Certified Genetic Counselor 02/26/2014

## 2014-03-06 ENCOUNTER — Other Ambulatory Visit (HOSPITAL_COMMUNITY): Payer: 59

## 2014-03-06 ENCOUNTER — Encounter (HOSPITAL_COMMUNITY): Payer: 59

## 2014-03-10 ENCOUNTER — Ambulatory Visit (HOSPITAL_COMMUNITY): Payer: 59

## 2014-03-26 ENCOUNTER — Encounter (HOSPITAL_COMMUNITY): Payer: Self-pay

## 2014-03-26 ENCOUNTER — Ambulatory Visit (HOSPITAL_COMMUNITY)
Admission: RE | Admit: 2014-03-26 | Discharge: 2014-03-26 | Disposition: A | Discharge status: Home / Self Care | Payer: 59 | Source: Ambulatory Visit | Attending: Obstetrics and Gynecology | Admitting: Obstetrics and Gynecology

## 2014-03-26 ENCOUNTER — Other Ambulatory Visit (HOSPITAL_COMMUNITY): Payer: Self-pay | Admitting: Maternal and Fetal Medicine

## 2014-03-26 DIAGNOSIS — Z3689 Encounter for other specified antenatal screening: Secondary | ICD-10-CM | POA: Insufficient documentation

## 2014-03-26 DIAGNOSIS — IMO0001 Reserved for inherently not codable concepts without codable children: Secondary | ICD-10-CM

## 2014-03-26 DIAGNOSIS — O30009 Twin pregnancy, unspecified number of placenta and unspecified number of amniotic sacs, unspecified trimester: Secondary | ICD-10-CM | POA: Insufficient documentation

## 2014-03-26 LAB — US OB FOLLOW UP

## 2014-04-15 ENCOUNTER — Ambulatory Visit (INDEPENDENT_AMBULATORY_CARE_PROVIDER_SITE_OTHER): Payer: 59 | Admitting: Cardiovascular Disease

## 2014-04-15 ENCOUNTER — Encounter: Payer: Self-pay | Admitting: Cardiovascular Disease

## 2014-04-15 VITALS — BP 118/60 | HR 113 | Ht 67.5 in | Wt 198.5 lb

## 2014-04-15 DIAGNOSIS — R002 Palpitations: Secondary | ICD-10-CM

## 2014-04-15 NOTE — Progress Notes (Signed)
04/15/2014 [name]   June 01, 1976  [id number]  Primary Physician No PCP Per Patient Primary Cardiologist: Lorretta Harp MD Renae Gloss   HPI:  [name] is a 38 year old married Caucasian female mother of one 50-year-old child referred by Dr. Butler Denmark, her OB/GYN, for evaluation of palpitations. She is currently [redacted] weeks pregnant with twins. She works as a Nurse, learning disability for [employer]. She has no cardiac risk factors. The palpitations began one month ago. They occur primarily when she is lying down. It lasted possibly 10 seconds at a time there are occasional associated with chest pain. She does drink one cup of coffee a day.   Current Outpatient Prescriptions  Medication Sig Dispense Refill  . montelukast (SINGULAIR) 10 MG tablet Take 10 mg by mouth at bedtime.        . pantoprazole (PROTONIX) 40 MG tablet Take 1 tablet by mouth every other day.      . Prenatal Vit-Fe Fumarate-FA (PRENATAL MULTIVITAMIN) TABS Take 1 tablet by mouth daily.  30 tablet  12  . RHINOCORT AQUA 32 MCG/ACT nasal spray Place 2 sprays into the nose daily.        No current facility-administered medications for this visit.    Allergies  Allergen Reactions  . Other Anaphylaxis    Tree nuts  . Cetirizine Hcl Hives  . Ofloxacin Hives  . Sulfonamide Derivatives Hives    History   Social History  . Marital Status: Married    Spouse Name: N/A    Number of Children: 0  . Years of Education: N/A   Occupational History  . Nurse, learning disability    Social History Main Topics  . Smoking status: Never Smoker   . Smokeless tobacco: Never Used  . Alcohol Use: No  . Drug Use: No  . Sexual Activity: Yes   Other Topics Concern  . Not on file   Social History Narrative   Regular exercise-yes, 4 times a week, running 1 mile   Diet: fruits and veggies, fish, chicken, loves cheese, water     Review of Systems: General: negative for chills, fever, night  sweats or weight changes.  Cardiovascular: negative for chest pain, dyspnea on exertion, edema, orthopnea, palpitations, paroxysmal nocturnal dyspnea or shortness of breath Dermatological: negative for rash Respiratory: negative for cough or wheezing Urologic: negative for hematuria Abdominal: negative for nausea, vomiting, diarrhea, bright red blood per rectum, melena, or hematemesis Neurologic: negative for visual changes, syncope, or dizziness All other systems reviewed and are otherwise negative except as noted above.    Blood pressure 118/60, pulse 113, height 5' 7.5" (1.715 m), weight 198 lb 8 oz (90.039 kg), last menstrual period 10/31/2013, unknown if currently breastfeeding.  General appearance: alert and no distress Neck: no adenopathy, no carotid bruit, no JVD, supple, symmetrical, trachea midline and thyroid not enlarged, symmetric, no tenderness/mass/nodules Lungs: clear to auscultation bilaterally Heart: soft outflow tract murmur probably related to benign flow murmur Extremities: 1+ edema bilaterally with palpable pedal pulses  EKG sinus tachycardia at 113 without ST or T wave changes  ASSESSMENT AND PLAN:   Palpitations Patient is a 38 year old married Caucasian female mother of one 79-year-old child who is well triplex pregnant with twins. She was referred by Dr. Butler Denmark, her OB/GYN for evaluation on of the new-onset palpitations. She had no palpitations to her first pregnancy. The palpitations began approximately 4 weeks ago. There was digitalized down. They last about 10 seconds upon leaving her  feeling washed out after termination. She does get occasional chest tightness with these episodes.Lorretta Harp MD FACP,FACC,FAHA, [facility] 04/15/2014 2:06 PM

## 2014-04-15 NOTE — Assessment & Plan Note (Signed)
Patient is a 38 year old married Caucasian female mother of one 72-year-old child who is well triplex pregnant with twins. She was referred by Dr. Butler Denmark, her OB/GYN for evaluation on of the new-onset palpitations. She had no palpitations to her first pregnancy. The palpitations began approximately 4 weeks ago. There was digitalized down. They last about 10 seconds upon leaving her feeling washed out after termination. She does get occasional chest tightness with these episodes.Marland Kitchen

## 2014-04-15 NOTE — Patient Instructions (Signed)
Your physician has requested that you have an echocardiogram. Echocardiography is a painless test that uses sound waves to create images of your heart. It provides your doctor with information about the size and shape of your heart and how well your heart's chambers and valves are working. This procedure takes approximately one hour. There are no restrictions for this procedure.  Your physician has recommended that you wear an event monitor. Event monitors are medical devices that record the heart's electrical activity. Doctors most often [place] these monitors to diagnose arrhythmias. Arrhythmias are problems with the speed or rhythm of the heartbeat. The monitor is a small, portable device. You can wear one while you do your normal daily activities. This is usually used to diagnose what is causing palpitations/syncope (passing out).  Your physician recommends that you schedule a follow-up appointment in: [place] with Dr.Berry

## 2014-04-23 ENCOUNTER — Ambulatory Visit (HOSPITAL_COMMUNITY): Payer: 59

## 2014-04-28 ENCOUNTER — Ambulatory Visit (HOSPITAL_COMMUNITY)
Admission: RE | Admit: 2014-04-28 | Discharge: 2014-04-28 | Disposition: A | Discharge status: Home / Self Care | Payer: 59 | Source: Ambulatory Visit | Attending: Internal Medicine | Admitting: Internal Medicine

## 2014-04-28 DIAGNOSIS — I519 Heart disease, unspecified: Secondary | ICD-10-CM

## 2014-04-28 DIAGNOSIS — Z331 Pregnant state, incidental: Secondary | ICD-10-CM | POA: Diagnosis not present

## 2014-04-28 DIAGNOSIS — R002 Palpitations: Secondary | ICD-10-CM | POA: Diagnosis present

## 2014-04-28 NOTE — Progress Notes (Signed)
2D Echocardiogram Complete.  04/28/2014   Ajeenah Heiny, RDCS

## 2014-05-01 ENCOUNTER — Telehealth: Payer: Self-pay | Admitting: Cardiovascular Disease

## 2014-05-01 ENCOUNTER — Other Ambulatory Visit: Payer: Self-pay | Admitting: *Deleted

## 2014-05-01 DIAGNOSIS — R002 Palpitations: Secondary | ICD-10-CM

## 2014-05-01 NOTE — Telephone Encounter (Signed)
Returned call to patient 04/28/14 echo results not available.Will send message to Dr.Berry's nurse Brita Romp RN.

## 2014-05-01 NOTE — Telephone Encounter (Signed)
Pt called in wanting to know the results to her echo that was done on 8/4. Please call  Thanks

## 2014-05-04 NOTE — Telephone Encounter (Signed)
Left message on name-verified VM with results

## 2014-05-16 ENCOUNTER — Encounter (HOSPITAL_COMMUNITY): Payer: Self-pay

## 2014-05-16 ENCOUNTER — Inpatient Hospital Stay (HOSPITAL_COMMUNITY)
Admission: AD | Admit: 2014-05-16 | Discharge: 2014-05-16 | Disposition: A | Discharge status: Home / Self Care | Payer: 59 | Source: Ambulatory Visit | Attending: Obstetrics and Gynecology | Admitting: Obstetrics and Gynecology

## 2014-05-16 DIAGNOSIS — O26859 Spotting complicating pregnancy, unspecified trimester: Secondary | ICD-10-CM | POA: Insufficient documentation

## 2014-05-16 DIAGNOSIS — O30009 Twin pregnancy, unspecified number of placenta and unspecified number of amniotic sacs, unspecified trimester: Secondary | ICD-10-CM | POA: Diagnosis not present

## 2014-05-16 DIAGNOSIS — N939 Abnormal uterine and vaginal bleeding, unspecified: Secondary | ICD-10-CM

## 2014-05-16 DIAGNOSIS — R109 Unspecified abdominal pain: Secondary | ICD-10-CM | POA: Insufficient documentation

## 2014-05-16 DIAGNOSIS — N898 Other specified noninflammatory disorders of vagina: Secondary | ICD-10-CM

## 2014-05-16 LAB — URINALYSIS, ROUTINE W REFLEX MICROSCOPIC
Bilirubin Urine: NEGATIVE
GLUCOSE, UA: NEGATIVE mg/dL
KETONES UR: NEGATIVE mg/dL
Nitrite: NEGATIVE
Protein, ur: NEGATIVE mg/dL
Specific Gravity, Urine: 1.02 (ref 1.005–1.030)
Urobilinogen, UA: 1 mg/dL (ref 0.0–1.0)
pH: 6.5 (ref 5.0–8.0)

## 2014-05-16 LAB — URINE MICROSCOPIC-ADD ON

## 2014-05-16 MED ORDER — TERBUTALINE SULFATE 1 MG/ML IJ SOLN
0.2500 mg | Freq: Once | INTRAMUSCULAR | Status: AC
  Administered 2014-05-16: 0.25 mg via SUBCUTANEOUS
  Filled 2014-05-16: qty 1

## 2014-05-16 NOTE — MAU Note (Signed)
Pt states here for lower abdominal cramping that pt noticed for a few days intermittently, however this am saw brown/red colored discharge and got concerned. No recent intercourse

## 2014-05-16 NOTE — MAU Provider Note (Signed)
History     CSN: [id number]  Arrival date and time: 05/16/14 1059   First Provider Initiated Contact with Patient 05/16/14 1202      Chief Complaint  Patient presents with  . Abdominal Pain  . Vaginal Bleeding   HPI  [name] is a 38 y.o. female G3P1011 at 44w1dtwin gestation presents with vaginal spotting and abdominal cramping. She woke up this morning and saw brown/ red discharge in her underwear. No recent intercourse. The abdominal pain is located in her lower abdomen and she rates it 1/10. + fetal movement, denies leaking of fluid.     OB History   Grav Para Term Preterm Abortions TAB SAB Ect Mult Living   3 1 1  0 1 0 1 0 0 1      Past Medical History  Diagnosis Date  . Chronic headaches   . Thrombocytopenia complicating pregnancy     mild  . AMA (advanced maternal age) multigravida 332+  . Abnormal Pap smear   . Gestational diabetes   . Subfertility of couple   . SVD (spontaneous vaginal delivery) 04/19/2012  . Palpitations   . Heart murmur     Past Surgical History  Procedure Laterality Date  . Colposcopy  2009  . Wisdom tooth extraction    . Hysteroscopy    . Resection of polyp      Family History  Problem Relation Age of Onset  . Arthritis Mother   . Depression Mother   . Hypertension Mother   . Hypothyroidism Mother   . Migraines Mother   . Miscarriages / [place]   . Hypertension Sister   . Hypothyroidism Sister   . Migraines Sister   . Coronary artery disease Maternal Grandfather   . Arthritis Maternal Grandfather   . Heart disease Maternal Grandfather   . Hypertension Maternal Grandfather   . Stroke Maternal Grandfather   . Coronary artery disease Paternal Grandfather   . Arthritis Paternal Grandfather   . Heart disease Paternal Grandfather   . Hypertension Paternal Grandfather   . Stroke Paternal Grandfather   . Cancer Other   . Obesity Other   . Cancer Maternal Aunt     breast and cervical  . Depression  Maternal Uncle   . Migraines Maternal Grandmother   . Arthritis Maternal Grandmother   . Hypertension Maternal Grandmother   . Stroke Maternal Grandmother   . Arthritis Paternal Grandmother   . Hypertension Paternal Grandmother   . Stroke Paternal Grandmother   . Cancer Maternal Aunt     leaukemia    History  Substance Use Topics  . Smoking status: Never Smoker   . Smokeless tobacco: Never Used  . Alcohol Use: No    Allergies:  Allergies  Allergen Reactions  . Other Anaphylaxis    Pt states that she is allergic to tree nuts.   . Cetirizine Hcl Hives  . Ofloxacin Hives  . Sulfonamide Derivatives Hives    Prescriptions prior to admission  Medication Sig Dispense Refill  . acetaminophen (TYLENOL) 500 MG tablet Take 500-1,000 mg by mouth every 6 (six) hours as needed for mild pain or headache.      . montelukast (SINGULAIR) 10 MG tablet Take 10 mg by mouth at bedtime.       . pantoprazole (PROTONIX) 40 MG tablet Take 40 mg by mouth daily.      . Prenatal Vit-Fe Fumarate-FA (PRENATAL MULTIVITAMIN) TABS Take 1 tablet by mouth daily.  30 tablet  12   Results for orders placed during the hospital encounter of 05/16/14 (from the past 48 hour(s))  URINALYSIS, ROUTINE W REFLEX MICROSCOPIC     Status: Abnormal   Collection Time    05/16/14 11:17 AM      Result Value Ref Range   Color, Urine YELLOW  YELLOW   APPearance CLEAR  CLEAR   Specific Gravity, Urine 1.020  1.005 - 1.030   pH 6.5  5.0 - 8.0   Glucose, UA NEGATIVE  NEGATIVE mg/dL   Hgb urine dipstick LARGE (*) NEGATIVE   Bilirubin Urine NEGATIVE  NEGATIVE   Ketones, ur NEGATIVE  NEGATIVE mg/dL   Protein, ur NEGATIVE  NEGATIVE mg/dL   Urobilinogen, UA 1.0  0.0 - 1.0 mg/dL   Nitrite NEGATIVE  NEGATIVE   Leukocytes, UA TRACE (*) NEGATIVE  URINE MICROSCOPIC-ADD ON     Status: Abnormal   Collection Time    05/16/14 11:17 AM      Result Value Ref Range   Squamous Epithelial / LPF FEW (*) RARE   WBC, UA 3-6  <3 WBC/hpf    RBC / HPF 0-2  <3 RBC/hpf   Bacteria, UA FEW (*) RARE    Review of Systems  Gastrointestinal: Positive for nausea. Negative for vomiting, abdominal pain, diarrhea and constipation.  Genitourinary: Negative for dysuria, urgency, frequency, hematuria and flank pain.       No vaginal discharge. + vaginal bleeding. No dysuria.   Musculoskeletal: Positive for back pain (No new back pain).   Physical Exam   Blood pressure 121/71, pulse 110, temperature 98.1 F (36.7 C), temperature source Oral, resp. rate 16, height 5' 7.5" (1.715 m), weight 94.121 kg (207 lb 8 oz), last menstrual period 10/31/2013, not currently breastfeeding.  Physical Exam  Constitutional: She is oriented to person, place, and time. She appears well-developed and well-nourished. No distress.  HENT:  Head: Normocephalic.  Eyes: Pupils are equal, round, and reactive to light.  Neck: Neck supple.  Respiratory: Effort normal.  GI: Soft. She exhibits no distension. There is no tenderness. There is no rebound and no guarding.  Genitourinary:  Speculum exam: Vagina - Small amount of creamy, brown discharge, no odor Cervix - No contact bleeding, no active bleeding from the cervix. Bimanual exam: Cervix closed Uterus non tender, enlarged  Chaperone present for exam.   Musculoskeletal: Normal range of motion.  Neurological: She is oriented to person, place, and time.  Skin: Skin is warm. She is not diaphoretic.  Psychiatric: Her behavior is normal.    Fetal Tracing Baby A: Baseline: 145 bpm Variability: Moderate Accelerations: 15x15 Decelerations: None  Fetal Tracing Baby B: Baseline: 140 bpm  Variability: Moderate  Accelerations: 15x15 Decelerations: None Toco: Q1-3 mins at 1200; Following terbutaline at 1400 no contractions noted.     MAU Course  Procedures None  MDM Discussed patient with Dr. Ulanda Edison. He would like the patient to receive 0.25 of terbutaline.  Unable to collect fetal fibronectin due  to spotting  O positive blood type  Patient drank one pitcher of water while in MAU  Pt states that her contractions are minimal following terbutaline; she feels a contraction approximately once every 30-40 mins,.   Assessment and Plan   A:  1. Vaginal spotting in pregnancy; second trimester    P:  Discharge home in stable condition Return to MAU if symptoms worsen Bleeding precautions Pelvic rest Follow up with OB as directed   [place], NP 05/18/2014, 8:09 AM

## 2014-07-20 ENCOUNTER — Telehealth (HOSPITAL_COMMUNITY): Payer: Self-pay | Admitting: *Deleted

## 2014-07-20 ENCOUNTER — Encounter (HOSPITAL_COMMUNITY): Payer: Self-pay | Admitting: *Deleted

## 2014-07-20 NOTE — Telephone Encounter (Signed)
Preadmission screen  

## 2014-07-22 ENCOUNTER — Encounter (HOSPITAL_COMMUNITY): Payer: Self-pay

## 2014-07-22 DIAGNOSIS — O30043 Twin pregnancy, dichorionic/diamniotic, third trimester: Secondary | ICD-10-CM | POA: Diagnosis present

## 2014-07-22 DIAGNOSIS — O24410 Gestational diabetes mellitus in pregnancy, diet controlled: Secondary | ICD-10-CM

## 2014-07-22 HISTORY — DX: Twin pregnancy, dichorionic/diamniotic, third trimester: O30.043

## 2014-07-22 HISTORY — DX: Gestational diabetes mellitus in pregnancy, diet controlled: O24.410

## 2014-07-22 MED ORDER — MONTELUKAST SODIUM 10 MG PO TABS
10.0000 mg | ORAL_TABLET | Freq: Every day | ORAL | Status: DC
  Administered 2014-07-24 (×2): 10 mg via ORAL
  Filled 2014-07-22 (×3): qty 1

## 2014-07-22 MED ORDER — PANTOPRAZOLE SODIUM 40 MG PO TBEC
40.0000 mg | DELAYED_RELEASE_TABLET | Freq: Every day | ORAL | Status: DC
  Administered 2014-07-24: 40 mg via ORAL
  Filled 2014-07-22 (×2): qty 1

## 2014-07-22 NOTE — H&P (Signed)
Amory Zbikowski is a 38 y.o. female G2P1001 at 37+6 with twin IUP (di/di) by FET.  +FM, no LOF, no VB, occ ctx.  Pregnancy complicated by GDM diet controlled, also elevated risk of Downs Syndrome (1:48).  Babies growth monitored, 1 % discordant.  vtx and transverse presentation.  D/W pt at length r/b/a of breech extraction.  Pt voices understanding.  Dated by transfer, c/w [place].     Maternal Medical History:  Fetal activity: Perceived fetal activity is normal.    Prenatal Complications - Diabetes: gestational. Diabetes is managed by diet.      OB History   Grav Para Term Preterm Abortions TAB SAB Ect Mult Living   3 1 1  0 1 0 1 0 0 1    G1 SAB G2 SVD, term, GDM G3 present.  Di/di by FET, GDM No abn pap, no STDs  Past Medical History  Diagnosis Date  . Chronic headaches   . Thrombocytopenia complicating pregnancy     mild  . AMA (advanced maternal age) multigravida 32+   . Abnormal Pap smear   . Subfertility of couple   . SVD (spontaneous vaginal delivery) 04/19/2012  . Palpitations   . Heart murmur   . Gestational diabetes     diet controlled  . Vaginal Pap smear, abnormal   . Dichorionic diamniotic twin pregnancy in third trimester 07/22/2014  . GDM, class A1 07/22/2014   Past Surgical History  Procedure Laterality Date  . Colposcopy  2009  . Wisdom tooth extraction    . Hysteroscopy    . Resection of polyp     Family History: family history includes Arthritis in her maternal grandfather, maternal grandmother, mother, paternal grandfather, and paternal grandmother; Cancer in her maternal aunt and maternal aunt; Coronary artery disease in her maternal grandfather and paternal grandfather; Depression in her maternal uncle and mother; Heart disease in her maternal grandfather and paternal grandfather; Hypertension in her maternal grandfather, maternal grandmother, mother, paternal grandfather, paternal grandmother, and sister; Hypothyroidism in her mother and sister; Migraines  in her maternal grandmother, mother, and sister; Miscarriages / Stillbirths in her mother; Stroke in her maternal grandfather, maternal grandmother, paternal grandfather, and paternal grandmother. Social History:  reports that she has never smoked. She has never used smokeless tobacco. She reports that she does not drink alcohol or use illicit drugs. (h/o smoking in early 20's) Nurse, learning disability, married Meds PNV, Singulair, Protonix All Floxin, Sulfa, Tree Nut, Zyrtec - hives   Prenatal Transfer Tool  Maternal Diabetes: Yes:  Diabetes Type:  Diet controlled Genetic Screening: Abnormal:  Results: Elevated risk of Trisomy 21 Maternal Ultrasounds/Referrals: Normal Fetal Ultrasounds or other Referrals:  None Maternal Substance Abuse:  No Significant Maternal Medications:  None Significant Maternal Lab Results:  Lab values include: Group B Strep negative Other Comments:  concordant growth  Review of Systems  Constitutional: Negative.   HENT: Negative.   Eyes: Negative.   Respiratory: Negative.   Cardiovascular: Negative.   Gastrointestinal: Negative.   Genitourinary: Negative.   Musculoskeletal: Negative.   Skin: Negative.   Neurological: Negative.   Psychiatric/Behavioral: Negative.       Last menstrual period 10/31/2013. Maternal Exam:  Abdomen: Fundal height is 46 (appropriate for gestation).   Estimated fetal weight is 6#, 6#.   Fetal presentation: vertex  Introitus: Normal vulva. Normal vagina.  Pelvis: adequate for delivery.   Cervix: Cervix evaluated by digital exam.     Physical Exam  Constitutional: She is oriented to person, place, and time.  She appears well-developed and well-nourished.  HENT:  Head: Normocephalic and atraumatic.  Cardiovascular: Normal rate and regular rhythm.   Respiratory: Effort normal and breath sounds normal. No respiratory distress. She has no wheezes.  GI: Soft. Bowel sounds are normal. She exhibits no distension. There is no tenderness.   Musculoskeletal: Normal range of motion.  Neurological: She is alert and oriented to person, place, and time.  Skin: Skin is warm and dry.  Psychiatric: She has a normal mood and affect. Her behavior is normal.    Prenatal labs: ABO, Rh: O/Positive/-- (04/29 0000) Antibody: Negative (04/29 0000) Rubella: Immune (04/29 0000) RPR: Nonreactive (04/29 0000)  HBsAg: Negative (04/29 0000)  HIV: Non-reactive (04/29 0000)  GBS:   negative  Tdap 05/11/14, Flu 06/23/14  CF neg/Hgb 13.9/ Plt 130K/ Ur Cx neg/Chl neg/ GC neg/ First Tri US WNL, 1:48 risk of Down's/MFM referral/glucola early 34 - 192'  Followed by [place] Good growth A R ight post plac female, nl anat B Left ant plac, female, nl anay Concordant growth   Assessment/Plan: 38yo G2P1001 at 37+6 with twin IUP by FET, di/di MFM agrees with IOL between 25 and 38 wk Pt voices understanding to r/b/a Plan for epidural IOL with ROM and pitocin   Bovard-Stuckert, Akeel Reffner 07/22/2014, 9:12 PM

## 2014-07-23 ENCOUNTER — Inpatient Hospital Stay (HOSPITAL_COMMUNITY): Payer: 59 | Admitting: Anesthesiology

## 2014-07-23 ENCOUNTER — Encounter (HOSPITAL_COMMUNITY): Payer: 59 | Admitting: Anesthesiology

## 2014-07-23 ENCOUNTER — Inpatient Hospital Stay (HOSPITAL_COMMUNITY): Payer: 59

## 2014-07-23 ENCOUNTER — Encounter (HOSPITAL_COMMUNITY)
Admission: RE | Disposition: A | Discharge status: Home / Self Care | Payer: Self-pay | Source: Ambulatory Visit | Attending: Obstetrics and Gynecology

## 2014-07-23 ENCOUNTER — Encounter (HOSPITAL_COMMUNITY): Payer: Self-pay

## 2014-07-23 ENCOUNTER — Inpatient Hospital Stay (HOSPITAL_COMMUNITY)
Admission: RE | Admit: 2014-07-23 | Discharge: 2014-07-25 | DRG: 767 | Disposition: A | Discharge status: Home / Self Care | Payer: 59 | Source: Ambulatory Visit | Attending: Obstetrics and Gynecology | Admitting: Obstetrics and Gynecology

## 2014-07-23 VITALS — BP 97/56 | HR 78 | Temp 98.1°F | Resp 16 | Ht 67.0 in | Wt 215.0 lb

## 2014-07-23 DIAGNOSIS — Z823 Family history of stroke: Secondary | ICD-10-CM

## 2014-07-23 DIAGNOSIS — O326XX1 Maternal care for compound presentation, fetus 1: Secondary | ICD-10-CM | POA: Diagnosis present

## 2014-07-23 DIAGNOSIS — D696 Thrombocytopenia, unspecified: Secondary | ICD-10-CM | POA: Diagnosis present

## 2014-07-23 DIAGNOSIS — O321XX2 Maternal care for breech presentation, fetus 2: Secondary | ICD-10-CM | POA: Diagnosis present

## 2014-07-23 DIAGNOSIS — O318X31 Other complications specific to multiple gestation, third trimester, fetus 1: Secondary | ICD-10-CM | POA: Diagnosis present

## 2014-07-23 DIAGNOSIS — O30043 Twin pregnancy, dichorionic/diamniotic, third trimester: Secondary | ICD-10-CM

## 2014-07-23 DIAGNOSIS — O318X32 Other complications specific to multiple gestation, third trimester, fetus 2: Secondary | ICD-10-CM | POA: Diagnosis present

## 2014-07-23 DIAGNOSIS — Z3A37 37 weeks gestation of pregnancy: Secondary | ICD-10-CM

## 2014-07-23 DIAGNOSIS — Z3689 Encounter for other specified antenatal screening: Secondary | ICD-10-CM

## 2014-07-23 DIAGNOSIS — O30003 Twin pregnancy, unspecified number of placenta and unspecified number of amniotic sacs, third trimester: Secondary | ICD-10-CM

## 2014-07-23 DIAGNOSIS — O9912 Other diseases of the blood and blood-forming organs and certain disorders involving the immune mechanism complicating childbirth: Secondary | ICD-10-CM | POA: Diagnosis present

## 2014-07-23 DIAGNOSIS — Z8249 Family history of ischemic heart disease and other diseases of the circulatory system: Secondary | ICD-10-CM | POA: Diagnosis not present

## 2014-07-23 DIAGNOSIS — O24420 Gestational diabetes mellitus in childbirth, diet controlled: Secondary | ICD-10-CM | POA: Diagnosis present

## 2014-07-23 DIAGNOSIS — O24410 Gestational diabetes mellitus in pregnancy, diet controlled: Secondary | ICD-10-CM

## 2014-07-23 HISTORY — DX: Gestational diabetes mellitus in pregnancy, diet controlled: O24.410

## 2014-07-23 HISTORY — DX: Twin pregnancy, dichorionic/diamniotic, third trimester: O30.043

## 2014-07-23 LAB — CBC
HCT: 32.2 % — ABNORMAL LOW (ref 36.0–46.0)
HCT: 28.2 % — ABNORMAL LOW (ref 36.0–46.0)
HEMOGLOBIN: 10.9 g/dL — AB (ref 12.0–15.0)
HEMOGLOBIN: 9.4 g/dL — AB (ref 12.0–15.0)
MCH: 29.5 pg (ref 26.0–34.0)
MCH: 29.2 pg (ref 26.0–34.0)
MCHC: 33.9 g/dL (ref 30.0–36.0)
MCHC: 33.3 g/dL (ref 30.0–36.0)
MCV: 87.3 fL (ref 78.0–100.0)
MCV: 87.6 fL (ref 78.0–100.0)
Platelets: 116 10*3/uL — ABNORMAL LOW (ref 150–400)
Platelets: 105 10*3/uL — ABNORMAL LOW (ref 150–400)
RBC: 3.69 MIL/uL — ABNORMAL LOW (ref 3.87–5.11)
RBC: 3.22 MIL/uL — ABNORMAL LOW (ref 3.87–5.11)
RDW: 14.5 % (ref 11.5–15.5)
RDW: 14.5 % (ref 11.5–15.5)
WBC: 12.3 10*3/uL — ABNORMAL HIGH (ref 4.0–10.5)
WBC: 15.6 10*3/uL — AB (ref 4.0–10.5)

## 2014-07-23 LAB — DIC (DISSEMINATED INTRAVASCULAR COAGULATION)PANEL
Fibrinogen: 286 mg/dL (ref 204–475)
INR: 1.14 (ref 0.00–1.49)
Smear Review: NONE SEEN
aPTT: 30 seconds (ref 24–37)

## 2014-07-23 LAB — DIC (DISSEMINATED INTRAVASCULAR COAGULATION) PANEL
D-Dimer, Quant: 7.02 ug/mL-FEU — ABNORMAL HIGH (ref 0.00–0.48)
PLATELETS: 105 10*3/uL — AB (ref 150–400)
PROTHROMBIN TIME: 14.7 s (ref 11.6–15.2)

## 2014-07-23 LAB — PREPARE RBC (CROSSMATCH)

## 2014-07-23 LAB — GLUCOSE, CAPILLARY
GLUCOSE-CAPILLARY: 85 mg/dL (ref 70–99)
Glucose-Capillary: 71 mg/dL (ref 70–99)

## 2014-07-23 LAB — RPR

## 2014-07-23 LAB — POSTPARTUM HEMORRHAGE PROTOCOL (BB NOTIFICATION)

## 2014-07-23 SURGERY — Surgical Case
Anesthesia: Epidural | Site: Vagina

## 2014-07-23 MED ORDER — MISOPROSTOL 200 MCG PO TABS
800.0000 ug | ORAL_TABLET | Freq: Once | ORAL | Status: AC
  Administered 2014-07-23: 800 ug via RECTAL
  Filled 2014-07-23: qty 4

## 2014-07-23 MED ORDER — OXYTOCIN 40 UNITS IN LACTATED RINGERS INFUSION - SIMPLE MED
INTRAVENOUS | Status: AC
  Filled 2014-07-23: qty 1000

## 2014-07-23 MED ORDER — EPHEDRINE 5 MG/ML INJ: 10.0000 mg | INTRAVENOUS | Status: DC | PRN

## 2014-07-23 MED ORDER — LIDOCAINE-EPINEPHRINE (PF) 2 %-1:200000 IJ SOLN
INTRAMUSCULAR | Status: AC
  Filled 2014-07-23: qty 20

## 2014-07-23 MED ORDER — ZOLPIDEM TARTRATE 5 MG PO TABS: 5.0000 mg | ORAL_TABLET | Freq: Every evening | ORAL | Status: DC | PRN

## 2014-07-23 MED ORDER — ONDANSETRON HCL 4 MG/2ML IJ SOLN: 4.0000 mg | INTRAMUSCULAR | Status: DC | PRN

## 2014-07-23 MED ORDER — OXYCODONE-ACETAMINOPHEN 5-325 MG PO TABS
2.0000 | ORAL_TABLET | ORAL | Status: DC | PRN
  Administered 2014-07-24 – 2014-07-25 (×6): 2 via ORAL
  Filled 2014-07-23 (×7): qty 2

## 2014-07-23 MED ORDER — OXYCODONE-ACETAMINOPHEN 5-325 MG PO TABS: 1.0000 | ORAL_TABLET | ORAL | Status: DC | PRN

## 2014-07-23 MED ORDER — ACETAMINOPHEN 325 MG PO TABS
650.0000 mg | ORAL_TABLET | ORAL | Status: DC | PRN
Start: 2014-07-23 — End: 2014-07-23
  Administered 2014-07-23: 650 mg via ORAL
  Filled 2014-07-23 (×2): qty 2

## 2014-07-23 MED ORDER — DIBUCAINE 1 % RE OINT: 1.0000 "application " | TOPICAL_OINTMENT | RECTAL | Status: DC | PRN

## 2014-07-23 MED ORDER — CITRIC ACID-SODIUM CITRATE 334-500 MG/5ML PO SOLN: 30.0000 mL | ORAL | Status: DC | PRN

## 2014-07-23 MED ORDER — SODIUM BICARBONATE 8.4 % IV SOLN
INTRAVENOUS | Status: DC | PRN
  Administered 2014-07-23: 5 mL via EPIDURAL

## 2014-07-23 MED ORDER — ONDANSETRON HCL 4 MG/2ML IJ SOLN
4.0000 mg | Freq: Four times a day (QID) | INTRAMUSCULAR | Status: DC | PRN
Start: 2014-07-23 — End: 2014-07-23

## 2014-07-23 MED ORDER — PRENATAL MULTIVITAMIN CH
1.0000 | ORAL_TABLET | Freq: Every day | ORAL | Status: DC
  Administered 2014-07-24: 1 via ORAL
  Filled 2014-07-23: qty 1

## 2014-07-23 MED ORDER — LACTATED RINGERS IV SOLN: 500.0000 mL | INTRAVENOUS | Status: DC | PRN

## 2014-07-23 MED ORDER — WITCH HAZEL-GLYCERIN EX PADS: 1.0000 "application " | MEDICATED_PAD | CUTANEOUS | Status: DC | PRN

## 2014-07-23 MED ORDER — SODIUM BICARBONATE 8.4 % IV SOLN
INTRAVENOUS | Status: AC
  Filled 2014-07-23: qty 50

## 2014-07-23 MED ORDER — LIDOCAINE HCL (PF) 1 % IJ SOLN
INTRAMUSCULAR | Status: DC | PRN
  Administered 2014-07-23: 6 mL
  Administered 2014-07-23: 4 mL

## 2014-07-23 MED ORDER — PHENYLEPHRINE 40 MCG/ML (10ML) SYRINGE FOR IV PUSH (FOR BLOOD PRESSURE SUPPORT): 80.0000 ug | PREFILLED_SYRINGE | INTRAVENOUS | Status: DC | PRN

## 2014-07-23 MED ORDER — PHENYLEPHRINE 40 MCG/ML (10ML) SYRINGE FOR IV PUSH (FOR BLOOD PRESSURE SUPPORT)
PREFILLED_SYRINGE | INTRAVENOUS | Status: AC
  Filled 2014-07-23: qty 10

## 2014-07-23 MED ORDER — IBUPROFEN 600 MG PO TABS: 600.0000 mg | ORAL_TABLET | Freq: Four times a day (QID) | ORAL | Status: DC

## 2014-07-23 MED ORDER — DIPHENHYDRAMINE HCL 50 MG/ML IJ SOLN: 12.5000 mg | INTRAMUSCULAR | Status: DC | PRN

## 2014-07-23 MED ORDER — LACTATED RINGERS IV SOLN: 500.0000 mL | Freq: Once | INTRAVENOUS | Status: DC

## 2014-07-23 MED ORDER — LACTATED RINGERS IV SOLN
INTRAVENOUS | Status: DC
  Administered 2014-07-23: 1000 mL via INTRAVENOUS
  Administered 2014-07-23: 15:00:00 via INTRAVENOUS

## 2014-07-23 MED ORDER — OXYCODONE-ACETAMINOPHEN 5-325 MG PO TABS: 2.0000 | ORAL_TABLET | ORAL | Status: DC | PRN

## 2014-07-23 MED ORDER — LANOLIN HYDROUS EX OINT: TOPICAL_OINTMENT | CUTANEOUS | Status: DC | PRN

## 2014-07-23 MED ORDER — SENNOSIDES-DOCUSATE SODIUM 8.6-50 MG PO TABS
2.0000 | ORAL_TABLET | ORAL | Status: DC
Start: 2014-07-24 — End: 2014-07-25
  Administered 2014-07-23: 2 via ORAL
  Filled 2014-07-23 (×2): qty 2

## 2014-07-23 MED ORDER — ONDANSETRON HCL 4 MG PO TABS: 4.0000 mg | ORAL_TABLET | ORAL | Status: DC | PRN

## 2014-07-23 MED ORDER — LIDOCAINE HCL (PF) 1 % IJ SOLN
30.0000 mL | INTRAMUSCULAR | Status: DC | PRN
  Filled 2014-07-23: qty 30

## 2014-07-23 MED ORDER — TERBUTALINE SULFATE 1 MG/ML IJ SOLN: 0.2500 mg | Freq: Once | INTRAMUSCULAR | Status: DC | PRN

## 2014-07-23 MED ORDER — OXYTOCIN BOLUS FROM INFUSION: 500.0000 mL | INTRAVENOUS | Status: DC

## 2014-07-23 MED ORDER — BENZOCAINE-MENTHOL 20-0.5 % EX AERO
1.0000 "application " | INHALATION_SPRAY | CUTANEOUS | Status: DC | PRN
  Administered 2014-07-24: 1 via TOPICAL
  Filled 2014-07-23: qty 56

## 2014-07-23 MED ORDER — FENTANYL 2.5 MCG/ML BUPIVACAINE 1/10 % EPIDURAL INFUSION (WH - ANES)
14.0000 mL/h | INTRAMUSCULAR | Status: DC | PRN
  Administered 2014-07-23: 14 mL/h via EPIDURAL

## 2014-07-23 MED ORDER — DIPHENHYDRAMINE HCL 25 MG PO CAPS: 25.0000 mg | ORAL_CAPSULE | Freq: Four times a day (QID) | ORAL | Status: DC | PRN

## 2014-07-23 MED ORDER — LACTATED RINGERS IV BOLUS (SEPSIS)
1000.0000 mL | Freq: Once | INTRAVENOUS | Status: AC
  Administered 2014-07-23: 1000 mL via INTRAVENOUS

## 2014-07-23 MED ORDER — OXYTOCIN 40 UNITS IN LACTATED RINGERS INFUSION - SIMPLE MED: 62.5000 mL/h | INTRAVENOUS | Status: DC

## 2014-07-23 MED ORDER — TETANUS-DIPHTH-ACELL PERTUSSIS 5-2.5-18.5 LF-MCG/0.5 IM SUSP: 0.5000 mL | Freq: Once | INTRAMUSCULAR | Status: DC

## 2014-07-23 MED ORDER — FENTANYL 2.5 MCG/ML BUPIVACAINE 1/10 % EPIDURAL INFUSION (WH - ANES): 14.0000 mL/h | INTRAMUSCULAR | Status: DC | PRN

## 2014-07-23 MED ORDER — SIMETHICONE 80 MG PO CHEW: 80.0000 mg | CHEWABLE_TABLET | ORAL | Status: DC | PRN

## 2014-07-23 MED ORDER — LACTATED RINGERS IV SOLN: INTRAVENOUS | Status: DC

## 2014-07-23 MED ORDER — OXYTOCIN 40 UNITS IN LACTATED RINGERS INFUSION - SIMPLE MED: 125.0000 mL/h | Freq: Once | INTRAVENOUS | Status: DC

## 2014-07-23 MED ORDER — OXYTOCIN 40 UNITS IN LACTATED RINGERS INFUSION - SIMPLE MED
1.0000 m[IU]/min | INTRAVENOUS | Status: DC
  Administered 2014-07-23 (×2): 6 m[IU]/min via INTRAVENOUS
  Administered 2014-07-23: 2 m[IU]/min via INTRAVENOUS
  Filled 2014-07-23: qty 1000

## 2014-07-23 MED ORDER — OXYCODONE-ACETAMINOPHEN 5-325 MG PO TABS
1.0000 | ORAL_TABLET | ORAL | Status: DC | PRN
  Administered 2014-07-23 (×2): 1 via ORAL
  Filled 2014-07-23 (×2): qty 1

## 2014-07-23 MED ORDER — BUTORPHANOL TARTRATE 1 MG/ML IJ SOLN: 1.0000 mg | INTRAMUSCULAR | Status: DC | PRN

## 2014-07-23 MED ORDER — DEXTROSE 5 % IV SOLN
1.0000 g | Freq: Two times a day (BID) | INTRAVENOUS | Status: AC
  Administered 2014-07-23 – 2014-07-24 (×2): 1 g via INTRAVENOUS
  Filled 2014-07-23 (×2): qty 1

## 2014-07-23 MED ORDER — FENTANYL 2.5 MCG/ML BUPIVACAINE 1/10 % EPIDURAL INFUSION (WH - ANES)
INTRAMUSCULAR | Status: AC
  Administered 2014-07-23: 10:00:00
  Filled 2014-07-23: qty 125

## 2014-07-23 SURGICAL SUPPLY — 35 items
CLAMP CORD UMBIL (MISCELLANEOUS) IMPLANT
CLOTH BEACON ORANGE TIMEOUT ST (SAFETY) IMPLANT
CONTAINER PREFILL 10% NBF 15ML (MISCELLANEOUS) IMPLANT
DRAPE SHEET LG 3/4 BI-LAMINATE (DRAPES) ×8 IMPLANT
DRSG OPSITE POSTOP 4X10 (GAUZE/BANDAGES/DRESSINGS) IMPLANT
DURAPREP 26ML APPLICATOR (WOUND CARE) IMPLANT
ELECT REM PT RETURN 9FT ADLT (ELECTROSURGICAL)
ELECTRODE REM PT RTRN 9FT ADLT (ELECTROSURGICAL) IMPLANT
EXTRACTOR VACUUM M CUP 4 TUBE (SUCTIONS) IMPLANT
EXTRACTOR VACUUM M CUP 4' TUBE (SUCTIONS)
GLOVE BIO SURGEON STRL SZ 6.5 (GLOVE) ×3 IMPLANT
GLOVE BIO SURGEONS STRL SZ 6.5 (GLOVE) ×1
GOWN STRL REUS W/TWL LRG LVL3 (GOWN DISPOSABLE) ×4 IMPLANT
KIT ABG SYR 3ML LUER SLIP (SYRINGE) IMPLANT
NEEDLE HYPO 25X5/8 SAFETYGLIDE (NEEDLE) IMPLANT
NS IRRIG 1000ML POUR BTL (IV SOLUTION) ×4 IMPLANT
PACK C SECTION WH (CUSTOM PROCEDURE TRAY) ×4 IMPLANT
PAD OB MATERNITY 4.3X12.25 (PERSONAL CARE ITEMS) IMPLANT
RTRCTR C-SECT PINK 25CM LRG (MISCELLANEOUS) IMPLANT
STAPLER VISISTAT 35W (STAPLE) IMPLANT
SUT MNCRL 0 VIOLET CTX 36 (SUTURE) IMPLANT
SUT MONOCRYL 0 CTX 36 (SUTURE)
SUT PLAIN 1 NONE 54 (SUTURE) IMPLANT
SUT PLAIN 2 0 XLH (SUTURE) IMPLANT
SUT VIC AB 0 CT1 27 (SUTURE)
SUT VIC AB 0 CT1 27XBRD ANBCTR (SUTURE) IMPLANT
SUT VIC AB 2-0 CT1 27 (SUTURE)
SUT VIC AB 2-0 CT1 TAPERPNT 27 (SUTURE) IMPLANT
SUT VIC AB 3-0 CT1 27 (SUTURE) ×2
SUT VIC AB 3-0 CT1 TAPERPNT 27 (SUTURE) ×2 IMPLANT
SUT VIC AB 4-0 KS 27 (SUTURE) IMPLANT
SYR BULB IRRIGATION 50ML (SYRINGE) IMPLANT
TOWEL OR 17X24 6PK STRL BLUE (TOWEL DISPOSABLE) ×4 IMPLANT
TRAY FOLEY CATH 14FR (SET/KITS/TRAYS/PACK) IMPLANT
WATER STERILE IRR 1000ML POUR (IV SOLUTION) IMPLANT

## 2014-07-23 NOTE — Anesthesia Preprocedure Evaluation (Signed)
Anesthesia Evaluation  Patient identified by MRN, date of birth, ID band Patient awake    Reviewed: Allergy & Precautions, H&P , Patient's Chart, lab work & pertinent test results  Airway Mallampati: II  TM Distance: >3 FB Neck ROM: full    Dental  (+) Teeth Intact   Pulmonary  breath sounds clear to auscultation        Cardiovascular Rhythm:regular Rate:Normal     Neuro/Psych    GI/Hepatic   Endo/Other  diabetes  Renal/GU      Musculoskeletal   Abdominal   Peds  Hematology   Anesthesia Other Findings  Twins Gest DM Gest Thrombocytopenia...116 this am     Reproductive/Obstetrics (+) Pregnancy                             Anesthesia Physical Anesthesia Plan  ASA: II  Anesthesia Plan: Epidural   Post-op Pain Management:    Induction:   Airway Management Planned:   Additional Equipment:   Intra-op Plan:   Post-operative Plan:   Informed Consent: I have reviewed the patients History and Physical, chart, labs and discussed the procedure including the risks, benefits and alternatives for the proposed anesthesia with the patient or authorized representative who has indicated his/her understanding and acceptance.   Dental Advisory Given  Plan Discussed with:   Anesthesia Plan Comments: (Labs checked- platelets confirmed with RN in room. Fetal heart tracing, per RN, reported to be stable enough for sitting procedure. Discussed epidural, and patient consents to the procedure:  included risk of possible headache,backache, failed block, allergic reaction, and nerve injury. This patient was asked if she had any questions or concerns before the procedure started.)        Anesthesia Quick Evaluation

## 2014-07-23 NOTE — Progress Notes (Signed)
Patient ID: [name], female   DOB: 1976/05/24, 38 y.o.   MRN: [id number]  Reviewed H&P with patient and r/b/a of twin delivery.  If vtx, vtx plan to deliver in room  AFVSS gen NAD FHTs 140's good var, category 1 A, B 140's good variability, category 1 toco q 62mn  SVE 4.5/50/-2 AROM for clear fluid  BS [place]- vtx/ ?vtx Will get official [place] presentation  Will monitor platelet's, h/o thromvocytopenic,  Epidural if OK prn

## 2014-07-23 NOTE — Anesthesia Procedure Notes (Signed)
Epidural Patient location during procedure: OB  Preanesthetic Checklist Completed: patient identified, site marked, surgical consent, pre-op evaluation, timeout performed, IV checked, risks and benefits discussed and monitors and equipment checked  Epidural Patient position: sitting Prep: site prepped and draped and DuraPrep Patient monitoring: continuous pulse ox and blood pressure Approach: midline Location: L3-L4 Injection technique: LOR air  Needle:  Needle type: Tuohy  Needle gauge: 17 G Needle length: 9 cm and 9 Needle insertion depth: 6 cm Catheter type: closed end flexible Catheter size: 19 Gauge Catheter at skin depth: 12 cm Test dose: negative  Assessment Events: blood not aspirated, injection not painful, no injection resistance, negative IV test and no paresthesia  Additional Notes Dosing of Epidural:  1st dose, through catheter ............................................Marland Kitchen  Xylocaine 40 mg  2nd dose, through catheter, after waiting 3 minutes........Marland KitchenXylocaine 60 mg    ( 1% Xylo charted as a single dose in Epic Meds for ease of charting; actual dosing was fractionated as above, for saftey's sake)  As each dose occurred, patient was free of IV sx; and patient exhibited no evidence of SA injection.  Patient is more comfortable after epidural dosed. Please see RN's note for documentation of vital signs,and FHR which are stable.  Patient reminded not to try to ambulate with numb legs, and that an RN must be present when she attempts to get up.

## 2014-07-23 NOTE — Lactation Note (Signed)
This note was copied from the chart of [name]. Lactation Consultation Note  Patient Name: [name] Date: 07/23/2014 Reason for consult: Initial assessment;Multiple gestation Twins born at 74 weeks 6 days and both babies are over 6 pounds.  Mom reports that latching is easier with the Lindsay twin "A" but "B" later latched with LATCH score=7.  "A" had blood sugars wnl but "B" had initial blood sugar of 46, then dropped to <40, then back >50 and no further testing has been done.  Both parents are in room, each one is holding one baby and no cuing is noted.  Babies are only 6 hours old.  Mom attempted to breastfeed her first baby, requiring help from private [facility] and provided some breast milk with supplement for 5 months due to low milk supply and latching difficulty.  Mom states she will supplement the twins as needed due to this hx but LC encouraged her to cue feed both babies for now, and hand express or initiate DEBP if feedings not consistent with either baby.Mom encouraged to feed baby 8-12 times/24 hours and with feeding cues. LC encouraged review of Baby and Me pp 9, 14 and 20-25 for STS and BF information. LC provided [employer] brochure and reviewed [facility] services and list of community and web site resources. Both twins have had a 15 minute feeding but no LATCH score recorded for "A" yet.      Maternal Data Formula Feeding for Exclusion: Yes Reason for exclusion: Mother's choice to formula and breast feed on admission Has patient been taught Hand Expression?: Yes (experienced mom who states she is able to hand express colostrum/milk) Does the patient have breastfeeding experience prior to this delivery?: Yes  Feeding    LATCH Score/Interventions          Both twins have had a 15 minute feeding but no LATCH score recorded for "A" yet.              Lactation Tools Discussed/Used   STS, cue feedings, hand expression (mom states that she knows how  to hand express colostrum/milk)   Consult Status Consult Status: Follow-up Date: 07/24/14 Follow-up type: In-patient    Junious Dresser [facility] 07/23/2014, 10:08 PM

## 2014-07-23 NOTE — Plan of Care (Signed)
Problem: Consults Goal: Diabetes Guidelines per MD order/protocol Outcome: Progressing Discussed effects on GDM on labor. Discussed Signs and symptoms of blood sugar changes. Pt verbalized understanding

## 2014-07-23 NOTE — Plan of Care (Signed)
Problem: Consults Goal: Prenatal labs/testing reviewed upon admission Outcome: Progressing Discussed results of platelet count.

## 2014-07-23 NOTE — Progress Notes (Signed)
In O.R.

## 2014-07-24 ENCOUNTER — Encounter (HOSPITAL_COMMUNITY): Payer: Self-pay | Admitting: Obstetrics and Gynecology

## 2014-07-24 LAB — CBC
HCT: 24.6 % — ABNORMAL LOW (ref 36.0–46.0)
HEMOGLOBIN: 8.5 g/dL — AB (ref 12.0–15.0)
MCH: 30 pg (ref 26.0–34.0)
MCHC: 34.6 g/dL (ref 30.0–36.0)
MCV: 86.9 fL (ref 78.0–100.0)
Platelets: 102 10*3/uL — ABNORMAL LOW (ref 150–400)
RBC: 2.83 MIL/uL — ABNORMAL LOW (ref 3.87–5.11)
RDW: 14.4 % (ref 11.5–15.5)
WBC: 15 10*3/uL — ABNORMAL HIGH (ref 4.0–10.5)

## 2014-07-24 NOTE — Progress Notes (Signed)
Patient is doing well . Patient is not dizzy and wants to ambulate.

## 2014-07-24 NOTE — Progress Notes (Signed)
Post Partum Day 1 Subjective: no complaints, up ad lib, voiding, tolerating PO and nl lochia, pain controlled.  Some dizziness  Objective: Blood pressure 115/62, pulse 80, temperature 98.6 F (37 C), temperature source Oral, resp. rate 18, height 5' 7"  (1.702 m), weight 97.523 kg (215 lb), last menstrual period 10/31/2013, SpO2 100.00%, unknown if currently breastfeeding.  Physical Exam:  General: alert and no distress Lochia: appropriate Uterine Fundus: firm    Recent Labs  07/23/14 1640 07/24/14 0640  HGB 9.4* 8.5*  HCT 28.2* 24.6*    Assessment/Plan: Plan for discharge tomorrow, Breastfeeding and Lactation consult.  Routine care.  D/c IVF, d/c foley.  After PP Hemorrhage Hgb seems stable.   LOS: 1 day   Bovard-Stuckert, Quindarius Cabello 07/24/2014, 7:32 AM

## 2014-07-24 NOTE — Transfer of Care (Signed)
Immediate Anesthesia Transfer of Care Note  Patient: [name]  Procedure(s) Performed: Procedure(s) with comments: VAGINAL DELIVERY - Twins  Patient Location: Labor and delivery RN  In OR Anesthesia Type:Epidural  Level of Consciousness: awake  Airway & Oxygen Therapy: Patient Spontanous Breathing  Post-op Assessment: Report given to PACU RN  Post vital signs: Reviewed and stable  Complications: No apparent anesthesia complications

## 2014-07-24 NOTE — Anesthesia Postprocedure Evaluation (Signed)
Anesthesia Post Note  Patient: Management consultant  Procedure(s) Performed: Procedure(s): VAGINAL DELIVERY  Anesthesia type: Epidural  Patient location: Mother/Baby  Post pain: Pain level controlled  Post assessment: Post-op Vital signs reviewed  Last Vitals:  Filed Vitals:   07/24/14 0520  BP: 115/62  Pulse: 80  Temp: 37 C  Resp: 18    Post vital signs: Reviewed  Level of consciousness:alert  Complications: No apparent anesthesia complications

## 2014-07-24 NOTE — Lactation Note (Signed)
This note was copied from the chart of [name]. Lactation Consultation Note  Patient Name: Lindsay Kennady Zimmerle [id number] Date: 07/24/2014 Reason for consult: Follow-up assessment;Multiple gestation Mom is both breastfeeding and formula feeding her twins and states that she does hear swallows when both babies are breastfeeding.  She has noted more breast fullness than her previous breastfeeding experience.  Mom does not want to pump at this time and is aware that this would increase stimulation of her milk supply but states she prefers breastfeeding as much as possible and supplementing with formula as needed.  LC encouraged mom to call for any LC assistance, as needed.  Mom also informed LC that the female twin ("B") has improving latch and had a 45 minute feeding earlier this afternoon.  Both twins have had first void and first stools and are just over 24 hours old.   Maternal Data Formula Feeding for Exclusion: Yes Reason for exclusion: Mother's choice to formula and breast feed on admission  Feeding Length of feed: 0 min  LATCH Score/Interventions      "B" baby had a LATCH score of "7" yesterday per RN assessment but none today "A" baby has not yet had a LATCH assessment; RN, Butch Penny is caring for twins and their mom and states she has not seen a latch with either baby today but mom denies latching difficulty                Lactation Tools Discussed/Used   q3h feedings, supplement as needed; pumping for extra stimulation as soon as able/willing  Consult Status Consult Status: Follow-up Date: 07/25/14 Follow-up type: In-patient    Junious Dresser [facility] 07/24/2014, 5:35 PM

## 2014-07-25 MED ORDER — OXYCODONE-ACETAMINOPHEN 5-325 MG PO TABS: 1.0000 | ORAL_TABLET | ORAL | Status: DC | PRN

## 2014-07-25 NOTE — Lactation Note (Signed)
This note was copied from the chart of [place]. Lactation Consultation Note: Follow up visit with mom before DC. Baby A latched then baby B fussing. Assisted in latching both babies at the same time. Reviewed positioning of twins and time savings of nursing both at the same time. No questions at present. Reviewed OP appointments as resource for support after DC To call prn  Patient Name: [name] [id number] Date: 07/25/2014 Reason for consult: Follow-up assessment   Maternal Data Formula Feeding for Exclusion: Yes Reason for exclusion: Mother's choice to formula and breast feed on admission Does the patient have breastfeeding experience prior to this delivery?: Yes  Feeding Feeding Type: Breast Fed Length of feed: 11 min  LATCH Score/Interventions Latch: Grasps breast easily, tongue down, lips flanged, rhythmical sucking.  Audible Swallowing: A few with stimulation  Type of Nipple: Everted at rest and after stimulation  Comfort (Breast/Nipple): Soft / non-tender     Hold (Positioning): Assistance needed to correctly position infant at breast and maintain latch. Intervention(s): Position options;Support Pillows  LATCH Score: 8  Lactation Tools Discussed/Used WIC Program: No   Consult Status Consult Status: Complete    Truddie Crumble 07/25/2014, 9:47 AM

## 2014-07-25 NOTE — Progress Notes (Signed)
PPD #2 Doing well Afeb, VSS D/c home

## 2014-07-25 NOTE — Lactation Note (Signed)
This note was copied from the chart of Lindsay Gladis Soley. Lactation Consultation Note: Baby awake and rooting when I went into room. Baby took a few attempts then off to sleep, Undressed and awakened- latched well Needed some stimulation to continue nursing. Mom reports these babies are doing much better than her first. No questions at present. To call prn.  Patient Name: [name] Date: 07/25/2014 Reason for consult: Follow-up assessment   Maternal Data Formula Feeding for Exclusion: Yes Reason for exclusion: Mother's choice to formula and breast feed on admission Does the patient have breastfeeding experience prior to this delivery?: Yes  Feeding Feeding Type: Breast Fed Length of feed: 17 min  LATCH Score/Interventions Latch: Grasps breast easily, tongue down, lips flanged, rhythmical sucking.  Audible Swallowing: A few with stimulation  Type of Nipple: Everted at rest and after stimulation  Comfort (Breast/Nipple): Soft / non-tender     Hold (Positioning): Assistance needed to correctly position infant at breast and maintain latch. Intervention(s): Support Pillows;Position options  LATCH Score: 8  Lactation Tools Discussed/Used WIC Program: No   Consult Status Consult Status: Complete    Truddie Crumble 07/25/2014, 9:43 AM

## 2014-07-25 NOTE — Discharge Summary (Signed)
Obstetric Discharge Summary Reason for Admission: induction of labor Prenatal Procedures: NST and ultrasound Intrapartum Procedures: spontaneous vaginal delivery Postpartum Procedures: none Complications-Operative and Postpartum: hemorrhage Hemoglobin  Date Value Ref Range Status  07/24/2014 8.5* 12.0 - 15.0 g/dL Final     HCT  Date Value Ref Range Status  07/24/2014 24.6* 36.0 - 46.0 % Final    Physical Exam:  General: alert Lochia: appropriate Uterine Fundus: firm   Discharge Diagnoses: Di/di twin pregnancy  Discharge Information: Date: 07/25/2014 Activity: pelvic rest Diet: routine Medications: Percocet Condition: stable Instructions: refer to practice specific booklet Discharge to: home Follow-up Information   Follow up with Bovard-Stuckert, Jody, MD. Schedule an appointment as soon as possible for a visit in 6 weeks.   Specialty:  Obstetrics and Gynecology   Contact information:   [address] [address] [phone]       Newborn Data:   Danique, H[place]ough [[id number]]  Live born female  Birth Weight: 6 lb 11.2 oz (3039 g) APGAR: 8, 9   Agueda, Houpt [[id number]]  Live born female  Birth Weight: 6 lb 1 oz (2750 g) APGAR: 4, 9  Home with mother.  Marsha Gundlach D 07/25/2014, 9:48 AM

## 2014-07-25 NOTE — Discharge Instructions (Signed)
As per discharge pamphlet

## 2014-07-26 LAB — TYPE AND SCREEN
ABO/RH(D): O POS
Antibody Screen: NEGATIVE
UNIT DIVISION: 0
Unit division: 0

## 2014-07-27 ENCOUNTER — Encounter (HOSPITAL_COMMUNITY): Payer: Self-pay | Admitting: Obstetrics and Gynecology

## 2016-04-12 LAB — HM MAMMOGRAPHY

## 2016-11-29 ENCOUNTER — Ambulatory Visit (INDEPENDENT_AMBULATORY_CARE_PROVIDER_SITE_OTHER): Payer: 59 | Admitting: Adult Health

## 2016-11-29 ENCOUNTER — Encounter: Payer: Self-pay | Admitting: Adult Health

## 2016-11-29 VITALS — BP 101/71 | HR 84 | Ht 67.5 in | Wt 194.3 lb

## 2016-11-29 DIAGNOSIS — Z0000 Encounter for general adult medical examination without abnormal findings: Secondary | ICD-10-CM | POA: Diagnosis not present

## 2016-11-29 DIAGNOSIS — Z8632 Personal history of gestational diabetes: Secondary | ICD-10-CM

## 2016-11-29 DIAGNOSIS — R5383 Other fatigue: Secondary | ICD-10-CM

## 2016-11-29 DIAGNOSIS — G43109 Migraine with aura, not intractable, without status migrainosus: Secondary | ICD-10-CM | POA: Diagnosis not present

## 2016-11-29 DIAGNOSIS — Z8249 Family history of ischemic heart disease and other diseases of the circulatory system: Secondary | ICD-10-CM

## 2016-11-29 DIAGNOSIS — Z8349 Family history of other endocrine, nutritional and metabolic diseases: Secondary | ICD-10-CM | POA: Diagnosis not present

## 2016-11-29 DIAGNOSIS — M25551 Pain in right hip: Secondary | ICD-10-CM | POA: Diagnosis not present

## 2016-11-29 MED ORDER — RIZATRIPTAN BENZOATE 10 MG PO TABS: 10.0000 mg | ORAL_TABLET | ORAL | 2 refills | Status: DC | PRN

## 2016-11-29 MED ORDER — ONDANSETRON HCL 4 MG PO TABS: 4.0000 mg | ORAL_TABLET | Freq: Three times a day (TID) | ORAL | 3 refills | Status: DC | PRN

## 2016-11-29 NOTE — Assessment & Plan Note (Signed)
Increase water intake, to at least 85 ounces daily. DoTerra- Frankincense applied to temples when migraine develops. Difue Lavendar or Rosemary in home to reduce anxiety/stress, promote calming. Maxalt and Ondansetron as needed.

## 2016-11-29 NOTE — Patient Instructions (Signed)
Migraine Headache A migraine headache is an intense, throbbing pain on one side or both sides of the head. Migraines may also cause other symptoms, such as nausea, vomiting, and sensitivity to light and noise. What are the causes? Doing or taking certain things may also trigger migraines, such as:  Alcohol.  Smoking.  Medicines, such as:  Medicine used to treat chest pain (nitroglycerine).  Birth control pills.  Estrogen pills.  Certain blood pressure medicines.  Aged cheeses, chocolate, or caffeine.  Foods or drinks that contain nitrates, glutamate, aspartame, or tyramine.  Physical activity. Other things that may trigger a migraine include:  Menstruation.  Pregnancy.  Hunger.  Stress, lack of sleep, too much sleep, or fatigue.  Weather changes. What increases the risk? The following factors may make you more likely to experience migraine headaches:  Age. Risk increases with age.  Family history of migraine headaches.  Being Caucasian.  Depression and anxiety.  Obesity.  Being a woman.  Having a hole in the heart (patent foramen ovale) or other heart problems. What are the signs or symptoms? The main symptom of this condition is pulsating or throbbing pain. Pain may:  Happen in any area of the head, such as on one side or both sides.  Interfere with daily activities.  Get worse with physical activity.  Get worse with exposure to bright lights or loud noises. Other symptoms may include:  Nausea.  Vomiting.  Dizziness.  General sensitivity to bright lights, loud noises, or smells. Before you get a migraine, you may get warning signs that a migraine is developing (aura). An aura may include:  Seeing flashing lights or having blind spots.  Seeing bright spots, halos, or zigzag lines.  Having tunnel vision or blurred vision.  Having numbness or a tingling feeling.  Having trouble talking.  Having muscle weakness. How is this diagnosed? A  migraine headache can be diagnosed based on:  Your symptoms.  A physical exam.  Tests, such as CT scan or MRI of the head. These imaging tests can help rule out other causes of headaches.  Taking fluid from the spine (lumbar puncture) and analyzing it (cerebrospinal fluid analysis, or CSF analysis). How is this treated? A migraine headache is usually treated with medicines that:  Relieve pain.  Relieve nausea.  Prevent migraines from coming back. Treatment may also include:  Acupuncture.  Lifestyle changes like avoiding foods that trigger migraines. Follow these instructions at home: Medicines   Take over-the-counter and prescription medicines only as told by your health care provider.  Do not drive or use heavy machinery while taking prescription pain medicine.  To prevent or treat constipation while you are taking prescription pain medicine, your health care provider may recommend that you:  Drink enough fluid to keep your urine clear or pale yellow.  Take over-the-counter or prescription medicines.  Eat foods that are high in fiber, such as fresh fruits and vegetables, whole grains, and beans.  Limit foods that are high in fat and processed sugars, such as fried and sweet foods. Lifestyle   Avoid alcohol use.  Do not use any products that contain nicotine or tobacco, such as cigarettes and e-cigarettes. If you need help quitting, ask your health care provider.  Get at least 8 hours of sleep every night.  Limit your stress. General instructions    Keep a journal to find out what may trigger your migraine headaches. For example, write down:  What you eat and drink.  How much sleep you  get.  Any change to your diet or medicines.  If you have a migraine:  Avoid things that make your symptoms worse, such as bright lights.  It may help to lie down in a dark, quiet room.  Do not drive or use heavy machinery.  Ask your health care provider what activities  are safe for you while you are experiencing symptoms.  Keep all follow-up visits as told by your health care provider. This is important. Contact a health care provider if:  You develop symptoms that are different or more severe than your usual migraine symptoms. Get help right away if:  Your migraine becomes severe.  You have a fever.  You have a stiff neck.  You have vision loss.  Your muscles feel weak or like you cannot control them.  You start to lose your balance often.  You develop trouble walking.  You faint. This information is not intended to replace advice given to you by your health care provider. Make sure you discuss any questions you have with your health care provider. Document Released: 09/11/2005 Document Revised: 03/31/2016 Document Reviewed: 02/28/2016 Elsevier Interactive Patient Education  2017 [employer].   Heart-Healthy Eating Plan Many factors influence your heart health, including eating and exercise habits. Heart (coronary) risk increases with abnormal blood fat (lipid) levels. Heart-healthy meal planning includes limiting unhealthy fats, increasing healthy fats, and making other small dietary changes. This includes maintaining a healthy body weight to help keep lipid levels within a normal range. What is my plan? Your health care provider recommends that you:  Get no more than _________% of the total calories in your daily diet from fat.  Limit your intake of saturated fat to less than _________% of your total calories each day.  Limit the amount of cholesterol in your diet to less than _________ mg per day. What types of fat should I choose?  Choose healthy fats more often. Choose monounsaturated and polyunsaturated fats, such as olive oil and canola oil, flaxseeds, walnuts, almonds, and seeds.  Eat more omega-3 fats. Good choices include salmon, mackerel, sardines, tuna, flaxseed oil, and ground flaxseeds. Aim to eat fish at least two times  each week.  Limit saturated fats. Saturated fats are primarily found in animal products, such as meats, butter, and cream. Plant sources of saturated fats include palm oil, palm kernel oil, and coconut oil.  Avoid foods with partially hydrogenated oils in them. These contain trans fats. Examples of foods that contain trans fats are stick margarine, some tub margarines, cookies, crackers, and other baked goods. What general guidelines do I need to follow?  Check food labels carefully to identify foods with trans fats or high amounts of saturated fat.  Fill one half of your plate with vegetables and green salads. Eat 4-5 servings of vegetables per day. A serving of vegetables equals 1 cup of raw leafy vegetables,  cup of raw or cooked cut-up vegetables, or  cup of vegetable juice.  Fill one fourth of your plate with whole grains. Look for the word "whole" as the first word in the ingredient list.  Fill one fourth of your plate with lean protein foods.  Eat 4-5 servings of fruit per day. A serving of fruit equals one medium whole fruit,  cup of dried fruit,  cup of fresh, frozen, or canned fruit, or  cup of 100% fruit juice.  Eat more foods that contain soluble fiber. Examples of foods that contain this type of fiber are apples, broccoli,  carrots, beans, peas, and barley. Aim to get 20-30 g of fiber per day.  Eat more home-cooked food and less restaurant, buffet, and fast food.  Limit or avoid alcohol.  Limit foods that are high in starch and sugar.  Avoid fried foods.  Cook foods by using methods other than frying. Baking, boiling, grilling, and broiling are all great options. Other fat-reducing suggestions include:  Removing the skin from poultry.  Removing all visible fats from meats.  Skimming the fat off of stews, soups, and gravies before serving them.  Steaming vegetables in water or broth.  Lose weight if you are overweight. Losing just 5-10% of your initial body weight  can help your overall health and prevent diseases such as diabetes and heart disease.  Increase your consumption of nuts, legumes, and seeds to 4-5 servings per week. One serving of dried beans or legumes equals  cup after being cooked, one serving of nuts equals 1 ounces, and one serving of seeds equals  ounce or 1 tablespoon.  You may need to monitor your salt (sodium) intake, especially if you have high blood pressure. Talk with your health care provider or dietitian to get more information about reducing sodium. What foods can I eat? Grains   Breads, including [place], white, pita, wheat, raisin, rye, oatmeal, and [place]. Tortillas that are neither fried nor made with lard or trans fat. Low-fat rolls, including hotdog and hamburger buns and English muffins. Biscuits. Muffins. Waffles. Pancakes. Light popcorn. Whole-grain cereals. Flatbread. Melba toast. Pretzels. Breadsticks. Rusks. Low-fat snacks and crackers, including oyster, saltine, matzo, graham, animal, and rye. Rice and pasta, including brown rice and those that are made with whole wheat. Vegetables  All vegetables. Fruits  All fruits, but limit coconut. Meats and Other Protein Sources  Lean, well-trimmed beef, veal, pork, and lamb. Chicken and [place] without skin. All fish and shellfish. Wild duck, rabbit, pheasant, and venison. Egg whites or low-cholesterol egg substitutes. Dried beans, peas, lentils, and tofu.Seeds and most nuts. Dairy  Low-fat or nonfat cheeses, including ricotta, string, and mozzarella. Skim or 1% milk that is liquid, powdered, or evaporated. Buttermilk that is made with low-fat milk. Nonfat or low-fat yogurt. Beverages  Mineral water. Diet carbonated beverages. Sweets and Desserts  Sherbets and fruit ices. Honey, jam, marmalade, jelly, and syrups. Meringues and gelatins. Pure sugar candy, such as hard candy, jelly beans, gumdrops, mints, marshmallows, and small amounts of dark chocolate. [employer]. Eat all sweets and desserts in moderation. Fats and Oils  Nonhydrogenated (trans-free) margarines. Vegetable oils, including soybean, sesame, sunflower, olive, peanut, safflower, corn, canola, and cottonseed. Salad dressings or mayonnaise that are made with a vegetable oil. Limit added fats and oils that you use for cooking, baking, salads, and as spreads. Other  Cocoa powder. Coffee and tea. All seasonings and condiments. The items listed above may not be a complete list of recommended foods or beverages. Contact your dietitian for more options.  What foods are not recommended? Grains  Breads that are made with saturated or trans fats, oils, or whole milk. Croissants. Butter rolls. Cheese breads. Sweet rolls. Donuts. Buttered popcorn. Chow mein noodles. High-fat crackers, such as cheese or butter crackers. Meats and Other Protein Sources  Fatty meats, such as hotdogs, short ribs, sausage, spareribs, bacon, ribeye roast or steak, and mutton. High-fat deli meats, such as salami and bologna. Caviar. Domestic duck and goose. Organ meats, such as kidney, liver, sweetbreads, brains, gizzard, chitterlings, and heart. Dairy  Cream, sour cream, cream  cheese, and creamed cottage cheese. Whole milk cheeses, including blue (bleu), Monterey Jack, [place], [place], American, [place], Swiss, [place], [place], and [place]. Whole or 2% milk that is liquid, evaporated, or condensed. Whole buttermilk. Cream sauce or high-fat cheese sauce. Yogurt that is made from whole milk. Beverages  Regular sodas and drinks with added sugar. Sweets and Desserts  Frosting. Pudding. Cookies. Cakes other than angel food cake. Candy that has milk chocolate or white chocolate, hydrogenated fat, butter, coconut, or unknown ingredients. Buttered syrups. Full-fat ice cream or ice cream drinks. Fats and Oils  Gravy that has suet, meat fat, or shortening. Cocoa butter, hydrogenated oils, palm oil, coconut oil, palm kernel oil. These  can often be found in baked products, candy, fried foods, nondairy creamers, and whipped toppings. Solid fats and shortenings, including bacon fat, salt pork, lard, and butter. Nondairy cream substitutes, such as coffee creamers and sour cream substitutes. Salad dressings that are made of unknown oils, cheese, or sour cream. The items listed above may not be a complete list of foods and beverages to avoid. Contact your dietitian for more information.  This information is not intended to replace advice given to you by your health care provider. Make sure you discuss any questions you have with your health care provider. Document Released: 06/20/2008 Document Revised: 03/31/2016 Document Reviewed: 03/05/2014 Elsevier Interactive Patient Education  2017 [employer].   Increase water intake, drink at least 85 ounces daily. Incorporate exercise into daily routine-walking, stretching, body weight movement, YouTube workout videos, PT  DoTerra- Frankincense applied to temples when migraine develops. Difue Lavendar or Rosemary in home to reduce anxiety/stress, promote calming. Annual follow-ups, sooner if needed.

## 2016-11-29 NOTE — Assessment & Plan Note (Signed)
Pain began after vaginal delivery of twins-2015 PT referral placed. Has had PT in the past and it "worked well".

## 2016-11-29 NOTE — Progress Notes (Signed)
Subjective:    Patient ID: [name], female    DOB: 1976-01-02, 41 y.o.   MRN: [id number]  HPI:  [name] presents to establish as a new pt.  She is a very pleasant healthy mother of 3 children (42 yr old and twin 2 yr olds).  PMH:  Migraine HA (4/10-9/10) that occurs weekly with mild sx's and 1-2 times month with severe sx's.  She reports aura and "spots in vision" at onset of migraine then pain, N/D, and extreme fatigue. She has been experiencing migraines for "decades, and my mother and sister also suffer from them as well".  She also had right hip pain that is intermittent 8/10 and began after twins were delivered in 2015.  She had PT that was quite successful in pain reduction and improved ROM.   Of note: she has normally lower BP SBP 90s, DBP 60-70s  Patient Care Team    Relationship Specialty Notifications Start End  Odella Aquas, NP PCP - General Family Medicine  11/29/16     Patient Active Problem List   Diagnosis Date Noted  . Right hip pain 11/29/2016  . Health care maintenance 11/29/2016  . SVD (spontaneous vaginal delivery) 07/23/2014  . Breech extraction, delivered 07/23/2014  . Dichorionic diamniotic twin pregnancy in third trimester 07/22/2014  . GDM, class A1 07/22/2014  . Palpitations 04/15/2014  . Migraine with aura 02/16/2009  . ALLERGIC RHINITIS 02/16/2009  . PAP SMEAR, ABNORMAL 02/16/2009  . BACK STRAIN, LUMBAR 02/16/2009  . PERSONAL HISTORY OF ALLERGY TO PEANUTS 02/16/2009     Past Medical History:  Diagnosis Date  . Abnormal Pap smear   . AMA (advanced maternal age) multigravida 51+   . Breech extraction, delivered 07/23/2014  . Chronic headaches   . Dichorionic diamniotic twin pregnancy in third trimester 07/22/2014  . GDM, class A1 07/22/2014  . Gestational diabetes    diet controlled  . Heart murmur   . Palpitations   . Subfertility of couple   . SVD (spontaneous vaginal delivery) 04/19/2012  . SVD (spontaneous vaginal delivery)  07/23/2014  . Thrombocytopenia complicating pregnancy (HCC)    mild  . Vaginal Pap smear, abnormal      Past Surgical History:  Procedure Laterality Date  . COLPOSCOPY  2009  . HYSTEROSCOPY    . resection of polyp    . VAGINAL DELIVERY  07/23/2014   Procedure: VAGINAL DELIVERY;  Surgeon: Janyth Contes, MD;  Location: [facility];  Service: Obstetrics;;  Twins  . WISDOM TOOTH EXTRACTION       Family History  Problem Relation Age of Onset  . Arthritis Mother   . Depression Mother   . Hypertension Mother   . Hypothyroidism Mother   . Migraines Mother   . Miscarriages / [place] Mother   . Hypertension Sister   . Hypothyroidism Sister   . Migraines Sister   . Healthy Father   . Coronary artery disease Maternal Grandfather   . Arthritis Maternal Grandfather   . Heart disease Maternal Grandfather   . Hypertension Maternal Grandfather   . Stroke Maternal Grandfather   . Coronary artery disease Paternal Grandfather   . Arthritis Paternal Grandfather   . Heart disease Paternal Grandfather   . Hypertension Paternal Grandfather   . Stroke Paternal Grandfather   . Cancer Maternal Aunt     breast and cervical  . Depression Maternal Uncle   . Migraines Maternal Grandmother   . Arthritis Maternal Grandmother   . Hypertension Maternal Grandmother   .  Stroke Maternal Grandmother   . Arthritis Paternal Grandmother   . Hypertension Paternal Grandmother   . Stroke Paternal Grandmother   . Cancer Maternal Aunt     leaukemia     History  Drug Use No     History  Alcohol Use No     History  Smoking Status  . Former Smoker  . Packs/day: 0.25  . Years: 2.00  . Quit date: 09/25/1998  Smokeless Tobacco  . Never Used     Outpatient Encounter Prescriptions as of 11/29/2016  Medication Sig  . acetaminophen (TYLENOL) 500 MG tablet Take 500-1,000 mg by mouth every 6 (six) hours as needed for mild pain or headache.  . fluticasone (FLONASE) 50 MCG/ACT nasal spray Place 2  sprays into both nostrils daily.  Marland Kitchen levonorgestrel (MIRENA) 20 MCG/24HR IUD 1 each by Intrauterine route once.  Marland Kitchen oxymetazoline (AFRIN) 0.05 % nasal spray Place 1 spray into both nostrils 2 (two) times daily as needed for congestion.  . sertraline (ZOLOFT) 25 MG tablet Take 1 tablet by mouth daily.  . ondansetron (ZOFRAN) 4 MG tablet Take 1 tablet (4 mg total) by mouth every 8 (eight) hours as needed for nausea or vomiting.  . rizatriptan (MAXALT) 10 MG tablet Take 1 tablet (10 mg total) by mouth as needed for migraine. May repeat in 2 hours if needed  . [DISCONTINUED] montelukast (SINGULAIR) 10 MG tablet Take 10 mg by mouth at bedtime.   . [DISCONTINUED] oxyCODONE-acetaminophen (PERCOCET/ROXICET) 5-325 MG per tablet Take 1-2 tablets by mouth every 4 (four) hours as needed.  . [DISCONTINUED] pantoprazole (PROTONIX) 40 MG tablet Take 40 mg by mouth daily.  . [DISCONTINUED] Prenatal Vit-Fe Fumarate-FA (PRENATAL MULTIVITAMIN) TABS Take 1 tablet by mouth daily.   No facility-administered encounter medications on file as of 11/29/2016.     Allergies: Other; Sulfa antibiotics; Cetirizine hcl; Ofloxacin; and Sulfonamide derivatives  Body mass index is 29.98 kg/m.  Blood pressure 101/71, pulse 84, height 5' 7.5" (1.715 m), weight 194 lb 4.8 oz (88.1 kg), unknown if currently breastfeeding.     Review of Systems  Constitutional: Positive for fatigue. Negative for activity change, appetite change, chills, diaphoresis, fever and unexpected weight change.  HENT: Negative for congestion.   Eyes: Negative for visual disturbance.  Respiratory: Negative for cough and shortness of breath.   Cardiovascular: Negative for chest pain, palpitations and leg swelling.  Gastrointestinal: Negative for abdominal distention, abdominal pain, constipation, diarrhea, nausea and vomiting.  Endocrine: Negative for cold intolerance, heat intolerance, polydipsia, polyphagia and polyuria.  Genitourinary: Negative for  difficulty urinating and flank pain.  Musculoskeletal: Positive for arthralgias and myalgias. Negative for back pain, gait problem and joint swelling.  Skin: Negative for color change, pallor, rash and wound.  Neurological: Positive for headaches. Negative for dizziness, tremors, weakness, light-headedness and numbness.  Psychiatric/Behavioral: Negative for agitation and sleep disturbance.       Objective:   Physical Exam  Constitutional: She is oriented to person, place, and time. She appears well-developed and well-nourished. No distress.  HENT:  Head: Normocephalic and atraumatic.  Right Ear: External ear normal.  Left Ear: External ear normal.  Eyes: Conjunctivae are normal. Pupils are equal, round, and reactive to light.  Neck: Normal range of motion. Neck supple.  Cardiovascular: Normal rate, regular rhythm, normal heart sounds and intact distal pulses.   No murmur heard. Pulmonary/Chest: Effort normal and breath sounds normal. No respiratory distress. She has no wheezes. She has no rales. She exhibits no tenderness.  Abdominal: Soft. Bowel sounds are normal. She exhibits no distension and no mass. There is no tenderness. There is no rebound and no guarding.  Musculoskeletal: Normal range of motion. She exhibits edema.       Right hip: She exhibits decreased strength and tenderness. She exhibits no swelling and no crepitus.  Lymphadenopathy:    She has no cervical adenopathy.  Neurological: She is alert and oriented to person, place, and time.  Skin: Skin is warm and dry. No rash noted. She is not diaphoretic. No erythema. No pallor.  Psychiatric: She has a normal mood and affect. Her behavior is normal. Judgment and thought content normal.  Nursing note and vitals reviewed.         Assessment & Plan:   1. Family history of hypothyroidism   2. History of gestational diabetes   3. Family history of coronary artery disease   4. Other fatigue   5. Right hip pain   6.  Migraine with aura and without status migrainosus, not intractable   7. Health care maintenance     Migraine with aura Increase water intake, to at least 85 ounces daily. DoTerra- Frankincense applied to temples when migraine develops. Difue Lavendar or Rosemary in home to reduce anxiety/stress, promote calming. Maxalt and Ondansetron as needed.  Right hip pain Pain began after vaginal delivery of twins-2015 PT referral placed. Has had PT in the past and it "worked well".  Health care maintenance Increase water intake to at least 85 ounces daily. Heart Healthy diet. Incorporate regular exercise into daily routine-walking, body weight movements, stretching, YouTube work out videos, PT.    FOLLOW-UP:  Return in about 1 year (around 11/29/2017) for Regular Follow Up.

## 2016-11-29 NOTE — Assessment & Plan Note (Signed)
Increase water intake to at least 85 ounces daily. Heart Healthy diet. Incorporate regular exercise into daily routine-walking, body weight movements, stretching, YouTube work out videos, PT.

## 2016-11-30 ENCOUNTER — Other Ambulatory Visit: Payer: Self-pay | Admitting: Adult Health

## 2016-11-30 DIAGNOSIS — E559 Vitamin D deficiency, unspecified: Secondary | ICD-10-CM

## 2016-11-30 LAB — HEMOGLOBIN A1C
Est. average glucose Bld gHb Est-mCnc: 88 mg/dL
Hgb A1c MFr Bld: 4.7 % — ABNORMAL LOW (ref 4.8–5.6)

## 2016-11-30 LAB — COMPREHENSIVE METABOLIC PANEL
ALBUMIN: 4.8 g/dL (ref 3.5–5.5)
ALT: 16 IU/L (ref 0–32)
AST: 23 IU/L (ref 0–40)
Albumin/Globulin Ratio: 1.8 (ref 1.2–2.2)
Alkaline Phosphatase: 74 IU/L (ref 39–117)
BUN / CREAT RATIO: 28 — AB (ref 9–23)
BUN: 23 mg/dL (ref 6–24)
Bilirubin Total: 0.9 mg/dL (ref 0.0–1.2)
CO2: 24 mmol/L (ref 18–29)
Calcium: 11.1 mg/dL — ABNORMAL HIGH (ref 8.7–10.2)
Chloride: 99 mmol/L (ref 96–106)
Creatinine, Ser: 0.83 mg/dL (ref 0.57–1.00)
GFR, EST AFRICAN AMERICAN: 102 mL/min/{1.73_m2} (ref >59)
GFR, EST NON AFRICAN AMERICAN: 88 mL/min/{1.73_m2} (ref >59)
Globulin, Total: 2.6 g/dL (ref 1.5–4.5)
Glucose: 86 mg/dL (ref 65–99)
POTASSIUM: 5.1 mmol/L (ref 3.5–5.2)
Sodium: 141 mmol/L (ref 134–144)
TOTAL PROTEIN: 7.4 g/dL (ref 6.0–8.5)

## 2016-11-30 LAB — CBC WITH DIFFERENTIAL/PLATELET
Basophils Absolute: 0 10*3/uL (ref 0.0–0.2)
Basos: 0 %
EOS (ABSOLUTE): 0.1 10*3/uL (ref 0.0–0.4)
EOS: 2 %
HEMOGLOBIN: 15.8 g/dL (ref 11.1–15.9)
Hematocrit: 46.3 % (ref 34.0–46.6)
IMMATURE GRANS (ABS): 0 10*3/uL (ref 0.0–0.1)
IMMATURE GRANULOCYTES: 0 %
Lymphocytes Absolute: 2.5 10*3/uL (ref 0.7–3.1)
Lymphs: 30 %
MCH: 30.7 pg (ref 26.6–33.0)
MCHC: 34.1 g/dL (ref 31.5–35.7)
MCV: 90 fL (ref 79–97)
MONOCYTES: 8 %
Monocytes Absolute: 0.7 10*3/uL (ref 0.1–0.9)
NEUTROS PCT: 60 %
Neutrophils Absolute: 4.9 10*3/uL (ref 1.4–7.0)
PLATELETS: 130 10*3/uL — AB (ref 150–379)
RBC: 5.14 x10E6/uL (ref 3.77–5.28)
RDW: 13.1 % (ref 12.3–15.4)
WBC: 8.3 10*3/uL (ref 3.4–10.8)

## 2016-11-30 LAB — LIPID PANEL
Chol/HDL Ratio: 3.6 ratio units (ref 0.0–4.4)
Cholesterol, Total: 247 mg/dL — ABNORMAL HIGH (ref 100–199)
HDL: 68 mg/dL (ref >39)
LDL CALC: 151 mg/dL — AB (ref 0–99)
TRIGLYCERIDES: 141 mg/dL (ref 0–149)
VLDL CHOLESTEROL CAL: 28 mg/dL (ref 5–40)

## 2016-11-30 LAB — TSH: TSH: 2.13 u[IU]/mL (ref 0.450–4.500)

## 2016-11-30 LAB — VITAMIN D 25 HYDROXY (VIT D DEFICIENCY, FRACTURES): Vit D, 25-Hydroxy: 17.4 ng/mL — ABNORMAL LOW (ref 30.0–100.0)

## 2016-11-30 MED ORDER — VITAMIN D (ERGOCALCIFEROL) 1.25 MG (50000 UNIT) PO CAPS: 50000.0000 [IU] | ORAL_CAPSULE | ORAL | 0 refills | Status: DC

## 2016-11-30 NOTE — Progress Notes (Unsigned)
ergo

## 2016-12-05 ENCOUNTER — Ambulatory Visit: Payer: 59 | Attending: Adult Health | Admitting: Physical Therapy

## 2016-12-05 ENCOUNTER — Encounter: Payer: Self-pay | Admitting: Physical Therapy

## 2016-12-05 DIAGNOSIS — M545 Low back pain: Secondary | ICD-10-CM | POA: Insufficient documentation

## 2016-12-05 DIAGNOSIS — M25551 Pain in right hip: Secondary | ICD-10-CM | POA: Insufficient documentation

## 2016-12-05 DIAGNOSIS — G8929 Other chronic pain: Secondary | ICD-10-CM | POA: Insufficient documentation

## 2016-12-05 NOTE — Therapy (Signed)
[place], [place], [address] Phone: [phone]   Fax:  [phone]  Physical Therapy Evaluation  Patient Details  Name: [name] MRN: [id number] Date of Birth: 04/20/1976 Referring Provider: Odella Aquas, NP  Encounter Date: 12/05/2016      PT End of Session - 12/05/16 1136    Visit Number 1   Number of Visits 13   Date for PT Re-Evaluation 01/19/17   Authorization Type U[employer]- 20 visit limit   PT Start Time 1140   PT Stop Time 1248   PT Time Calculation (min) 68 min   Activity Tolerance Patient tolerated treatment well   Behavior During Therapy [facility] for tasks assessed/performed      Past Medical History:  Diagnosis Date  . Abnormal Pap smear   . AMA (advanced maternal age) multigravida 58+   . Breech extraction, delivered 07/23/2014  . Chronic headaches   . Dichorionic diamniotic twin pregnancy in third trimester 07/22/2014  . GDM, class A1 07/22/2014  . Gestational diabetes    diet controlled  . Heart murmur   . Palpitations   . Subfertility of couple   . SVD (spontaneous vaginal delivery) 04/19/2012  . SVD (spontaneous vaginal delivery) 07/23/2014  . Thrombocytopenia complicating pregnancy (HCC)    mild  . Vaginal Pap smear, abnormal     Past Surgical History:  Procedure Laterality Date  . COLPOSCOPY  2009  . HYSTEROSCOPY    . resection of polyp    . VAGINAL DELIVERY  07/23/2014   Procedure: VAGINAL DELIVERY;  Surgeon: Janyth Contes, MD;  Location: [facility];  Service: Obstetrics;;  Twins  . WISDOM TOOTH EXTRACTION      There were no vitals filed for this visit.       Subjective Assessment - 12/05/16 1143    Subjective Vaginal delivery [redacted] weeks gestation two years ago, twins. About one year later began noticing R hip and LBP that was referring down leg. Did PT for about 6 months which was helpful but was d/c before feeling resolution of pain. Went [place] theme park one  weekend and has had worsening pain since (approx Sept/Oct). Increasingly getting worse.    Patient Stated Goals pick up children, getting up from floor, manage pain independently, return to exercise program   Currently in Pain? Yes   Pain Score 6    Pain Location Back   Pain Orientation Lower;Right   Pain Descriptors / Indicators Aching   Pain Radiating Towards R lateral thigh   Pain Onset More than a month ago   Pain Frequency Constant   Aggravating Factors  getting up from floor, picking up children   Pain Relieving Factors crossing R leg over L            [facility] PT Assessment - 12/05/16 0001      Assessment   Medical Diagnosis R hip pain   Referring Provider Odella Aquas, NP   Hand Dominance Right   Next MD Visit not scheduled at this time   Prior Therapy yes, last year     Precautions   Precautions None     Restrictions   Weight Bearing Restrictions No     Balance Screen   Has the patient fallen in the past 6 months No     [place] residence   Living Arrangements Spouse/significant other;Children   Additional Comments steps to enter home     Prior Function  Level of Independence Independent     Cognition   Overall Cognitive Status Within Functional Limits for tasks assessed     Observation/Other Assessments   Focus on Therapeutic Outcomes (FOTO)  45% ability  goal 61% ability     Sensation   Additional Comments [facility]     Posture/Postural Control   Posture Comments R hip elevation     ROM / Strength   AROM / PROM / Strength --  not tested at eval due to notable pelvic rotation     Palpation   SI assessment  TTP R SI; L post innom rotation   Palpation comment TTP R piriformis, QL                   [facility] Adult PT Treatment/Exercise - 12/05/16 0001      Exercises   Exercises Knee/Hip;Lumbar     Modalities   Modalities Social worker  Location bilat SIJ   Electrical Stimulation Action IFC   Electrical Stimulation Parameters 15 min concurrent with moist hot pack  5 min concurrent with education   Electrical Stimulation Goals Pain     Manual Therapy   Manual Therapy Muscle Energy Technique;Myofascial release   Myofascial Release R QL, piriformis   Muscle Energy Technique L post/R ant innom rotation, iso abd & add  3 rounds 6x6                PT Education - 12/05/16 1309    Education provided Yes   Education Details anatomy of condition, POC, HEP, exercise form/rationale, rationale for manual   Person(s) Educated Patient   Methods Explanation;Tactile cues;Verbal cues   Comprehension Verbalized understanding;Need further instruction          PT Short Term Goals - 12/05/16 1318      PT SHORT TERM GOAL #1   Title Pt will verbalize resting pain <=5/10 to indicate decreasing pain levels by 4/6   Baseline 7/10 at eval   Time 3   Period Weeks   Status New     PT SHORT TERM GOAL #2   Title Pt will demo proper lifting posture from floor to waist height to normalize pressure through lumbopelvic region   Baseline poor mechanics due to pain    Time 3   Period Weeks   Status New           PT Long Term Goals - 12/05/16 1320      PT LONG TERM GOAL #1   Title Pt will be able to lift children SIJ pain <=2/10 by 4/27   Baseline severe pain at eval   Time 6   Period Weeks   Status New     PT LONG TERM GOAL #2   Title Pt will be able to get up and down from the floor without increase in pain to play with children   Baseline severe pain at eval   Time 6   Period Weeks   Status New     PT LONG TERM GOAL #3   Title FOTO to 61% ability to indicate significant improvement in functional ability   Baseline 45% ability at eval   Time 6   Period Weeks   Status New     PT LONG TERM GOAL #4   Title Pt will verbalize beginning return to independent exercise at home for continued overall health and  wellness and return to PLOF   Baseline not  exercising at eval due to pain   Time 6   Period Weeks   Status New     PT LONG TERM GOAL #5   Title Pt will verbalize knowledge for independence in self management of innominate rotation for future rotation cases   Baseline began educating at eval   Time 6   Period Weeks   Status New               Plan - 12/05/16 1311    Clinical Impression Statement Pt presents to PT today with complaints of R LBP and lateral R hip pain that is chronic since giving birth to twins 2 years ago. Pt with notable elevation of R ASIS and L innom rotation resulting in spasm of musculature surrounding R SIJ. MET was utilized to correct alignment and trigger point release to relax musculature. Pt reported feeling better following treatment today. ESTIM and heat utilized to decrease musculature irritation. Pt will benefit from skilled PT to strengthen lumbopelvic stability and train functional movements to decrease pain and meet long term functional goals.    Rehab Potential Good   PT Frequency 2x / week   PT Duration 6 weeks   PT Treatment/Interventions ADLs/Self Care Home Management;Cryotherapy;Electrical Stimulation;Iontophoresis 29m/ml Dexamethasone;Functional mobility training;Stair training;Gait training;Ultrasound;Traction;Moist Heat;Therapeutic activities;Therapeutic exercise;Balance training;Neuromuscular re-education;Patient/family education;Passive range of motion;Manual techniques;Dry needling;Taping   PT Next Visit Plan check rotation, MET PRN, lumbo pelvic stability, DN when avail- R piriformis, QL, TFL   PT Home Exercise Plan stand equal on feet, avoid crossing legs, iso add in supine and seated   Consulted and Agree with Plan of Care Patient      Patient will benefit from skilled therapeutic intervention in order to improve the following deficits and impairments:  Difficulty walking, Increased muscle spasms, Decreased activity tolerance, Pain,  Improper body mechanics, Decreased strength, Postural dysfunction  Visit Diagnosis: Chronic right-sided low back pain, with sciatica presence unspecified - Plan: PT plan of care cert/re-cert  Pain in right hip - Plan: PT plan of care cert/re-cert     Problem List Patient Active Problem List   Diagnosis Date Noted  . Right hip pain 11/29/2016  . Health care maintenance 11/29/2016  . SVD (spontaneous vaginal delivery) 07/23/2014  . Breech extraction, delivered 07/23/2014  . Dichorionic diamniotic twin pregnancy in third trimester 07/22/2014  . GDM, class A1 07/22/2014  . Palpitations 04/15/2014  . Migraine with aura 02/16/2009  . ALLERGIC RHINITIS 02/16/2009  . PAP SMEAR, ABNORMAL 02/16/2009  . BACK STRAIN, LUMBAR 02/16/2009  . PERSONAL HISTORY OF ALLERGY TO PEANUTS 02/16/2009    Novalee Horsfall C. Romualdo Prosise PT, DPT 12/05/16 1:26 PM   [facility] [address]Phone: [phone]  Fax:  [phone] Name: Lindsay DiefendorfMRN: [id number] of Birth: [birth year]

## 2016-12-08 ENCOUNTER — Ambulatory Visit: Payer: 59 | Admitting: Physical Therapy

## 2016-12-08 ENCOUNTER — Encounter: Payer: Self-pay | Admitting: Physical Therapy

## 2016-12-08 DIAGNOSIS — M545 Low back pain: Principal | ICD-10-CM

## 2016-12-08 DIAGNOSIS — M25551 Pain in right hip: Secondary | ICD-10-CM

## 2016-12-08 DIAGNOSIS — G8929 Other chronic pain: Secondary | ICD-10-CM

## 2016-12-08 NOTE — Therapy (Signed)
[place], [place], [address] Phone: [phone]   Fax:  [phone]  Physical Therapy Treatment  Patient Details  Name: [name] MRN: [id number] Date of Birth: 1976-08-22 Referring Provider: Odella Aquas, NP  Encounter Date: 12/08/2016      PT End of Session - 12/08/16 0849    Visit Number 2   Number of Visits 13   Date for PT Re-Evaluation 01/19/17   Authorization Type UHC- 20 visit limit   PT Start Time 0849   PT Stop Time 0933   PT Time Calculation (min) 44 min   Activity Tolerance Patient tolerated treatment well   Behavior During Therapy [facility] for tasks assessed/performed      Past Medical History:  Diagnosis Date  . Abnormal Pap smear   . AMA (advanced maternal age) multigravida 58+   . Breech extraction, delivered 07/23/2014  . Chronic headaches   . Dichorionic diamniotic twin pregnancy in third trimester 07/22/2014  . GDM, class A1 07/22/2014  . Gestational diabetes    diet controlled  . Heart murmur   . Palpitations   . Subfertility of couple   . SVD (spontaneous vaginal delivery) 04/19/2012  . SVD (spontaneous vaginal delivery) 07/23/2014  . Thrombocytopenia complicating pregnancy (HCC)    mild  . Vaginal Pap smear, abnormal     Past Surgical History:  Procedure Laterality Date  . COLPOSCOPY  2009  . HYSTEROSCOPY    . resection of polyp    . VAGINAL DELIVERY  07/23/2014   Procedure: VAGINAL DELIVERY;  Surgeon: Janyth Contes, MD;  Location: [facility];  Service: Obstetrics;;  Twins  . WISDOM TOOTH EXTRACTION      There were no vitals filed for this visit.      Subjective Assessment - 12/08/16 0849    Subjective pt reports feeling really good for about 24 hours after last visit. began feeling a little pinchy again but not as bad as it was. only comfortable when laying on L. found herself crossing her legs so she put a ruler between her knees.    Patient Stated  Goals pick up children, getting up from floor, manage pain independently, return to exercise program   Currently in Pain? Yes   Pain Score 5    Pain Location Back   Pain Orientation Right;Lower                         [facility] Adult PT Treatment/Exercise - 12/08/16 0001      Lumbar Exercises: Stretches   Standing Side Bend Limitations QL stretch in door     Lumbar Exercises: Seated   Sit to Stand Limitations seatd pelvic tilt for abdominal engagement     Manual Therapy   Manual Therapy Joint mobilization   Joint Mobilization R sIJ roll, prone rib ER mobility   Myofascial Release R QL, piriformis, glut med, TFL, R paraspinals   Muscle Energy Technique L post/R ant innom                PT Education - 12/08/16 0853    Education provided Yes   Education Details rebound of muscles. manual rationale   Person(s) Educated Patient   Methods Explanation;Demonstration;Tactile cues;Verbal cues   Comprehension Verbalized understanding;Returned demonstration;Verbal cues required;Tactile cues required;Need further instruction          PT Short Term Goals - 12/05/16 1318      PT SHORT TERM GOAL #1  Title Pt will verbalize resting pain <=5/10 to indicate decreasing pain levels by 4/6   Baseline 7/10 at eval   Time 3   Period Weeks   Status New     PT SHORT TERM GOAL #2   Title Pt will demo proper lifting posture from floor to waist height to normalize pressure through lumbopelvic region   Baseline poor mechanics due to pain    Time 3   Period Weeks   Status New           PT Long Term Goals - 12/05/16 1320      PT LONG TERM GOAL #1   Title Pt will be able to lift children SIJ pain <=2/10 by 4/27   Baseline severe pain at eval   Time 6   Period Weeks   Status New     PT LONG TERM GOAL #2   Title Pt will be able to get up and down from the floor without increase in pain to play with children   Baseline severe pain at eval   Time 6   Period Weeks    Status New     PT LONG TERM GOAL #3   Title FOTO to 61% ability to indicate significant improvement in functional ability   Baseline 45% ability at eval   Time 6   Period Weeks   Status New     PT LONG TERM GOAL #4   Title Pt will verbalize beginning return to independent exercise at home for continued overall health and wellness and return to PLOF   Baseline not exercising at eval due to pain   Time 6   Period Weeks   Status New     PT LONG TERM GOAL #5   Title Pt will verbalize knowledge for independence in self management of innominate rotation for future rotation cases   Baseline began educating at eval   Time 6   Period Weeks   Status New               Plan - 12/08/16 8242    Clinical Impression Statement Pt with trigger points in R glut med and QL that referred to her foot and were decreased with treatment. Unable to obtain shotgun with MET today and pt cont to present with slight rotation due to excessive musculature tightness. Discussed posture and habits to maintain gains made today.    PT Next Visit Plan check rotation, MET PRN, lumbo pelvic stability, DN when avail- R piriformis, QL, TFL, glut med   PT Home Exercise Plan stand equal on feet, avoid crossing legs, iso add in supine and seated, QL stretch in door, seated pelvic tilt   Consulted and Agree with Plan of Care Patient      Patient will benefit from skilled therapeutic intervention in order to improve the following deficits and impairments:     Visit Diagnosis: Chronic right-sided low back pain, with sciatica presence unspecified  Pain in right hip     Problem List Patient Active Problem List   Diagnosis Date Noted  . Right hip pain 11/29/2016  . Health care maintenance 11/29/2016  . SVD (spontaneous vaginal delivery) 07/23/2014  . Breech extraction, delivered 07/23/2014  . Dichorionic diamniotic twin pregnancy in third trimester 07/22/2014  . GDM, class A1 07/22/2014  . Palpitations  04/15/2014  . Migraine with aura 02/16/2009  . ALLERGIC RHINITIS 02/16/2009  . PAP SMEAR, ABNORMAL 02/16/2009  . BACK STRAIN, LUMBAR 02/16/2009  . PERSONAL HISTORY OF  ALLERGY TO PEANUTS 02/16/2009    Charm Stenner C. Azavion Bouillon PT, DPT 12/08/16 2:36 PM   [facility] [address] [place], [place], [address] Phone: [phone]   Fax:  [phone]  Name: Lurine Imel MRN: [id number] Date of Birth: [birth year]

## 2016-12-13 ENCOUNTER — Ambulatory Visit: Payer: 59 | Admitting: Physical Therapy

## 2016-12-13 ENCOUNTER — Encounter: Payer: Self-pay | Admitting: Physical Therapy

## 2016-12-13 DIAGNOSIS — M25551 Pain in right hip: Secondary | ICD-10-CM

## 2016-12-13 DIAGNOSIS — M545 Low back pain: Secondary | ICD-10-CM | POA: Diagnosis not present

## 2016-12-13 DIAGNOSIS — G8929 Other chronic pain: Secondary | ICD-10-CM

## 2016-12-13 NOTE — Therapy (Signed)
[facility] [address] [place], [place], [address] Phone: [phone]   Fax:  [phone]  Physical Therapy Treatment  Patient Details  Name: [name] MRN: [id number] Date of Birth: 05/01/1976 Referring Provider: [name] D Bess, NP  Encounter Date: 12/13/2016      PT End of Session - 12/13/16 1552    Visit Number 3   Number of Visits 13   Date for PT Re-Evaluation 01/19/17   Authorization Type UHC- 20 visit limit   PT Start Time 1552  pt arrived late   PT Stop Time 1630   PT Time Calculation (min) 38 min   Activity Tolerance Patient tolerated treatment well   Behavior During Therapy [facility] for tasks assessed/performed      Past Medical History:  Diagnosis Date  . Abnormal Pap smear   . AMA (advanced maternal age) multigravida 35+   . Breech extraction, delivered 07/23/2014  . Chronic headaches   . Dichorionic diamniotic twin pregnancy in third trimester 07/22/2014  . GDM, class A1 07/22/2014  . Gestational diabetes    diet controlled  . Heart murmur   . Palpitations   . Subfertility of couple   . SVD (spontaneous vaginal delivery) 04/19/2012  . SVD (spontaneous vaginal delivery) 07/23/2014  . Thrombocytopenia complicating pregnancy (HCC)    mild  . Vaginal Pap smear, abnormal     Past Surgical History:  Procedure Laterality Date  . COLPOSCOPY  2009  . HYSTEROSCOPY    . resection of polyp    . VAGINAL DELIVERY  07/23/2014   Procedure: VAGINAL DELIVERY;  Surgeon: Jody Bovard-Stuckert, MD;  Location: [facility];  Service: Obstetrics;;  Twins  . WISDOM TOOTH EXTRACTION      There were no vitals filed for this visit.      Subjective Assessment - 12/13/16 1552    Subjective (P)  pt reports feeling really good until yesterday evening. tried self correction but still felt pinching. feeling pulling down ITBand. has been trying to concentrate on standing straight and not crossing legs. QL stretch is helpful.    Patient Stated Goals (P)  pick up children, getting up from floor, manage pain independently, return to exercise program   Currently in Pain? (P)  Yes   Pain Score (P)  5    Pain Location (P)  Back  R SIJ   Pain Orientation (P)  Right                         [facility] Adult PT Treatment/Exercise - 12/13/16 0001      Manual Therapy   Manual therapy comments 2 layer shoe lift in L shoe, edu in use of tennis ball for trigger point release   Joint Mobilization long axis LLE traction gr 4; post FA capsule gr 4, Gr4 L SIJ PA   Myofascial Release trigger point release R QL   Muscle Energy Technique L post innom MET                PT Education - 12/13/16 1638    Education provided Yes   Education Details rationale for manual, connection of muscles and movement of pain, wear of shoe lift   Person(s) Educated Patient   Methods Explanation;Demonstration;Tactile cues;Verbal cues   Comprehension Verbalized understanding;Returned demonstration;Verbal cues required;Tactile cues required;Need further instruction          PT Short Term Goals - 12/05/16 1318      PT SHORT TERM GOAL #  1   Title Pt will verbalize resting pain <=5/10 to indicate decreasing pain levels by 4/6   Baseline 7/10 at eval   Time 3   Period Weeks   Status New     PT SHORT TERM GOAL #2   Title Pt will demo proper lifting posture from floor to waist height to normalize pressure through lumbopelvic region   Baseline poor mechanics due to pain    Time 3   Period Weeks   Status New           PT Long Term Goals - 12/05/16 1320      PT LONG TERM GOAL #1   Title Pt will be able to lift children SIJ pain <=2/10 by 4/27   Baseline severe pain at eval   Time 6   Period Weeks   Status New     PT LONG TERM GOAL #2   Title Pt will be able to get up and down from the floor without increase in pain to play with children   Baseline severe pain at eval   Time 6   Period Weeks   Status New      PT LONG TERM GOAL #3   Title FOTO to 61% ability to indicate significant improvement in functional ability   Baseline 45% ability at eval   Time 6   Period Weeks   Status New     PT LONG TERM GOAL #4   Title Pt will verbalize beginning return to independent exercise at home for continued overall health and wellness and return to PLOF   Baseline not exercising at eval due to pain   Time 6   Period Weeks   Status New     PT LONG TERM GOAL #5   Title Pt will verbalize knowledge for independence in self management of innominate rotation for future rotation cases   Baseline began educating at eval   Time 6   Period Weeks   Status New               Plan - 12/13/16 1635    Clinical Impression Statement Worked to mobilize L hip and R SIJ today to decrease piriformis and TFL spasm. Added 2 layers of shoe lift to L shoe which pt reported decreased discomfort down the lateral R leg and felt just a small pinching around R SIJ. Asked pt to wear the shoe lift as much as she can between now and her next appointment (tomorrow) so we can evaluate the effectiveness.    PT Next Visit Plan check rotation, MET PRN, lumbo pelvic stability, DN when avail- R piriformis, QL, TFL, glut med   PT Home Exercise Plan stand equal on feet, avoid crossing legs, iso add in supine and seated, QL stretch in door, seated pelvic tilt   Consulted and Agree with Plan of Care Patient      Patient will benefit from skilled therapeutic intervention in order to improve the following deficits and impairments:     Visit Diagnosis: Chronic right-sided low back pain, with sciatica presence unspecified  Pain in right hip     Problem List Patient Active Problem List   Diagnosis Date Noted  . Right hip pain 11/29/2016  . Health care maintenance 11/29/2016  . SVD (spontaneous vaginal delivery) 07/23/2014  . Breech extraction, delivered 07/23/2014  . Dichorionic diamniotic twin pregnancy in third trimester  07/22/2014  . GDM, class A1 07/22/2014  . Palpitations 04/15/2014  . Migraine with aura 02/16/2009  .  ALLERGIC RHINITIS 02/16/2009  . PAP SMEAR, ABNORMAL 02/16/2009  . BACK STRAIN, LUMBAR 02/16/2009  . PERSONAL HISTORY OF ALLERGY TO PEANUTS 02/16/2009    Jessica C. Hightower PT, DPT 12/13/16 4:39 PM   [facility] [address] [place], [place], [address] Phone: [phone]   Fax:  [phone]  Name: Jimmy Bassinger MRN: [id number] Date of Birth: 02/09/1976   

## 2016-12-14 ENCOUNTER — Ambulatory Visit: Payer: 59 | Admitting: Physical Therapy

## 2016-12-18 ENCOUNTER — Ambulatory Visit: Payer: 59 | Admitting: Physical Therapy

## 2016-12-18 ENCOUNTER — Encounter: Payer: Self-pay | Admitting: Physical Therapy

## 2016-12-18 DIAGNOSIS — M545 Low back pain: Principal | ICD-10-CM

## 2016-12-18 DIAGNOSIS — G8929 Other chronic pain: Secondary | ICD-10-CM

## 2016-12-18 DIAGNOSIS — M25551 Pain in right hip: Secondary | ICD-10-CM

## 2016-12-18 NOTE — Therapy (Signed)
[place], [place], [address] Phone: [phone]   Fax:  [phone]  Physical Therapy Treatment  Patient Details  Name: [name] MRN: [id number] Date of Birth: 01-13-1976 Referring Provider: Odella Aquas, NP  Encounter Date: 12/18/2016      PT End of Session - 12/18/16 1106    Visit Number 4   Number of Visits 13   Date for PT Re-Evaluation 01/19/17   Authorization Type [employer]- 20 visit limit   PT Start Time 1102   PT Stop Time 1147   PT Time Calculation (min) 45 min   Activity Tolerance Patient tolerated treatment well   Behavior During Therapy [facility] for tasks assessed/performed      Past Medical History:  Diagnosis Date  . Abnormal Pap smear   . AMA (advanced maternal age) multigravida 40+   . Breech extraction, delivered 07/23/2014  . Chronic headaches   . Dichorionic diamniotic twin pregnancy in third trimester 07/22/2014  . GDM, class A1 07/22/2014  . Gestational diabetes    diet controlled  . Heart murmur   . Palpitations   . Subfertility of couple   . SVD (spontaneous vaginal delivery) 04/19/2012  . SVD (spontaneous vaginal delivery) 07/23/2014  . Thrombocytopenia complicating pregnancy (HCC)    mild  . Vaginal Pap smear, abnormal     Past Surgical History:  Procedure Laterality Date  . COLPOSCOPY  2009  . HYSTEROSCOPY    . resection of polyp    . VAGINAL DELIVERY  07/23/2014   Procedure: VAGINAL DELIVERY;  Surgeon: Janyth Contes, MD;  Location: [facility];  Service: Obstetrics;;  Twins  . WISDOM TOOTH EXTRACTION      There were no vitals filed for this visit.      Subjective Assessment - 12/18/16 1104    Subjective Feels pain along R side of spine up to shoulder, pulling/pinching. Feels good with the lift-feels that the pain changed. Will be walking around more tomorrow with her kids.    Patient Stated Goals pick up children, getting up from floor, manage pain  independently, return to exercise program   Currently in Pain? Yes                         [place] Adult PT Treatment/Exercise - 12/18/16 0001      Lumbar Exercises: Standing   Other Standing Lumbar Exercises standing opp step/punch red tband     Knee/Hip Exercises: Aerobic   Stationary Bike 5 min L5     Knee/Hip Exercises: Supine   Bridges with Clamshell 15 reps  blue     Knee/Hip Exercises: Sidelying   Clams x15 blue tband. R only     Manual Therapy   Joint Mobilization gross rib ER   Myofascial Release soft tissue and trigger point release R paraspinals, QL, piriformis, periscapular, upper trap                PT Education - 12/18/16 1152    Education provided Yes   Education Details exercise form/rationale, rationale for manual, progression of symptoms, HEP   Person(s) Educated Patient   Methods Explanation;Demonstration;Tactile cues;Verbal cues;Handout   Comprehension Verbalized understanding;Returned demonstration;Verbal cues required;Tactile cues required;Need further instruction          PT Short Term Goals - 12/05/16 1318      PT SHORT TERM GOAL #1   Title Pt will verbalize resting pain <=5/10 to indicate decreasing pain levels by 4/6  Baseline 7/10 at eval   Time 3   Period Weeks   Status New     PT SHORT TERM GOAL #2   Title Pt will demo proper lifting posture from floor to waist height to normalize pressure through lumbopelvic region   Baseline poor mechanics due to pain    Time 3   Period Weeks   Status New           PT Long Term Goals - 12/05/16 1320      PT LONG TERM GOAL #1   Title Pt will be able to lift children SIJ pain <=2/10 by 4/27   Baseline severe pain at eval   Time 6   Period Weeks   Status New     PT LONG TERM GOAL #2   Title Pt will be able to get up and down from the floor without increase in pain to play with children   Baseline severe pain at eval   Time 6   Period Weeks   Status New     PT  LONG TERM GOAL #3   Title FOTO to 61% ability to indicate significant improvement in functional ability   Baseline 45% ability at eval   Time 6   Period Weeks   Status New     PT LONG TERM GOAL #4   Title Pt will verbalize beginning return to independent exercise at home for continued overall health and wellness and return to PLOF   Baseline not exercising at eval due to pain   Time 6   Period Weeks   Status New     PT LONG TERM GOAL #5   Title Pt will verbalize knowledge for independence in self management of innominate rotation for future rotation cases   Baseline began educating at eval   Time 6   Period Weeks   Status New               Plan - 12/18/16 1109    Clinical Impression Statement Pt with neutral alignment of pelvis today with tightness/spasm as expected in R QL and paraspinals as the post rotation pulls them into a stretch. Concordant pain into R periscapular musculature, most notably subscapularis. Pt reported decrease in concordant pain following treatment. Unable to return this week due to company in town. We discussed that I would expect her to have some soreness along the same patterns as she complained of today but less intense and we will be able to focus more on SIJ pain when surrounding musculature decreases irritatbility.    PT Next Visit Plan check rotation, MET PRN, lumbo pelvic stability, DN when avail- R piriformis, QL, TFL, glut med   PT Home Exercise Plan stand equal on feet, avoid crossing legs, iso add in supine and seated, QL stretch in door, seated pelvic tilt, clam, child pose, opp arm/leg step and punch   Consulted and Agree with Plan of Care Patient      Patient will benefit from skilled therapeutic intervention in order to improve the following deficits and impairments:     Visit Diagnosis: Chronic right-sided low back pain, with sciatica presence unspecified  Pain in right hip     Problem List Patient Active Problem List    Diagnosis Date Noted  . Right hip pain 11/29/2016  . Health care maintenance 11/29/2016  . SVD (spontaneous vaginal delivery) 07/23/2014  . Breech extraction, delivered 07/23/2014  . Dichorionic diamniotic twin pregnancy in third trimester 07/22/2014  . GDM, class  A1 07/22/2014  . Palpitations 04/15/2014  . Migraine with aura 02/16/2009  . ALLERGIC RHINITIS 02/16/2009  . PAP SMEAR, ABNORMAL 02/16/2009  . BACK STRAIN, LUMBAR 02/16/2009  . PERSONAL HISTORY OF ALLERGY TO PEANUTS 02/16/2009    Shahad Mazurek C. Kymir Coles PT, DPT 12/18/16 11:53 AM   [facility] [address] [place], [place], [address] Phone: [phone]   Fax:  [phone]  Name: [name] MRN: [id number] Date of Birth: 04-Sep-1976

## 2016-12-19 ENCOUNTER — Ambulatory Visit: Payer: 59 | Admitting: Adult Health

## 2016-12-25 ENCOUNTER — Ambulatory Visit: Payer: 59 | Attending: Adult Health | Admitting: Physical Therapy

## 2016-12-25 ENCOUNTER — Encounter: Payer: Self-pay | Admitting: Physical Therapy

## 2016-12-25 DIAGNOSIS — M25551 Pain in right hip: Secondary | ICD-10-CM | POA: Diagnosis present

## 2016-12-25 DIAGNOSIS — G8929 Other chronic pain: Secondary | ICD-10-CM | POA: Insufficient documentation

## 2016-12-25 DIAGNOSIS — M545 Low back pain: Secondary | ICD-10-CM | POA: Diagnosis not present

## 2016-12-25 NOTE — Therapy (Signed)
[place], [place], [address] Phone: [phone]   Fax:  [phone]  Physical Therapy Treatment  Patient Details  Name: [name] MRN: [id number] Date of Birth: [birth year] Referring Provider: Odella Aquas, NP  Encounter Date: 12/25/2016      PT End of Session - 12/25/16 0852    Visit Number 5   Number of Visits 13   Date for PT Re-Evaluation 01/19/17   Authorization Type UHC- 20 visit limit   PT Start Time 0848   PT Stop Time 0928   PT Time Calculation (min) 40 min   Activity Tolerance Patient tolerated treatment well   Behavior During Therapy [facility] for tasks assessed/performed      Past Medical History:  Diagnosis Date  . Abnormal Pap smear   . AMA (advanced maternal age) multigravida 70+   . Breech extraction, delivered 07/23/2014  . Chronic headaches   . Dichorionic diamniotic twin pregnancy in third trimester 07/22/2014  . GDM, class A1 07/22/2014  . Gestational diabetes    diet controlled  . Heart murmur   . Palpitations   . Subfertility of couple   . SVD (spontaneous vaginal delivery) 04/19/2012  . SVD (spontaneous vaginal delivery) 07/23/2014  . Thrombocytopenia complicating pregnancy (HCC)    mild  . Vaginal Pap smear, abnormal     Past Surgical History:  Procedure Laterality Date  . COLPOSCOPY  2009  . HYSTEROSCOPY    . resection of polyp    . VAGINAL DELIVERY  07/23/2014   Procedure: VAGINAL DELIVERY;  Surgeon: Janyth Contes, MD;  Location: [facility];  Service: Obstetrics;;  Twins  . WISDOM TOOTH EXTRACTION      There were no vitals filed for this visit.      Subjective Assessment - 12/25/16 0850    Subjective Reports walking a lot since last visit. No pain in shoulder since last visit. Ardine Eng pose helps a lot. Reports daughter dropping zoo map for which she lunged to catch which resulted in pain-unsure if she is rotated or pulled something.   Patient Stated  Goals pick up children, getting up from floor, manage pain independently, return to exercise program   Currently in Pain? Yes   Pain Score 6    Pain Location Hip  along ASIS   Pain Orientation Right                         [facility] Adult PT Treatment/Exercise - 12/25/16 0001      Lumbar Exercises: Stretches   Prone Mid Back Stretch Limitations child pose     Lumbar Exercises: Prone   Other Prone Lumbar Exercises plank on elbows & knees     Knee/Hip Exercises: Aerobic   Tread Mill 6 min walk with SI belt     Knee/Hip Exercises: Supine   Bridges with Ball Squeeze 15 reps  cues to lift R hip     Knee/Hip Exercises: Sidelying   Clams x30 yellow tband, L only     Manual Therapy   Manual therapy comments placed SI belt following manual   Muscle Energy Technique L post/R ant innom rotation  edge of bed                PT Education - 12/25/16 0931    Education provided Yes   Education Details exercise form/rationale, increased stability & setbacks   Person(s) Educated Patient   Methods Explanation;Demonstration;Tactile cues;Verbal cues   Comprehension  Verbalized understanding;Returned demonstration;Verbal cues required;Tactile cues required;Need further instruction          PT Short Term Goals - 12/05/16 1318      PT SHORT TERM GOAL #1   Title Pt will verbalize resting pain <=5/10 to indicate decreasing pain levels by 4/6   Baseline 7/10 at eval   Time 3   Period Weeks   Status New     PT SHORT TERM GOAL #2   Title Pt will demo proper lifting posture from floor to waist height to normalize pressure through lumbopelvic region   Baseline poor mechanics due to pain    Time 3   Period Weeks   Status New           PT Long Term Goals - 12/05/16 1320      PT LONG TERM GOAL #1   Title Pt will be able to lift children SIJ pain <=2/10 by 4/27   Baseline severe pain at eval   Time 6   Period Weeks   Status New     PT LONG TERM GOAL #2    Title Pt will be able to get up and down from the floor without increase in pain to play with children   Baseline severe pain at eval   Time 6   Period Weeks   Status New     PT LONG TERM GOAL #3   Title FOTO to 61% ability to indicate significant improvement in functional ability   Baseline 45% ability at eval   Time 6   Period Weeks   Status New     PT LONG TERM GOAL #4   Title Pt will verbalize beginning return to independent exercise at home for continued overall health and wellness and return to PLOF   Baseline not exercising at eval due to pain   Time 6   Period Weeks   Status New     PT LONG TERM GOAL #5   Title Pt will verbalize knowledge for independence in self management of innominate rotation for future rotation cases   Baseline began educating at eval   Time 6   Period Weeks   Status New               Plan - 12/25/16 0910    Clinical Impression Statement Notable pelvic rotation corrected with MET today, reported feeling supported with SI belt. L trendelenburg in gait following MET. Planks modified to elbows/knees in order to perform with appropraite form, fatigue notable. Decreased trendelenburg with activation of L glut med and abdominal wall. Pt reported feeling much better than when she walked in. Pt required strong movement such as large, quick lunge to rotate pelvis indicating increasing strength into stability. will continue to challenge stability but I do not believe she requires an SI belt at this time.    PT Next Visit Plan check rotation, MET PRN, lumbo pelvic stability, DN when avail- R piriformis, QL, TFL, glut med   PT Home Exercise Plan stand equal on feet, avoid crossing legs, iso add in supine and seated, QL stretch in door, seated pelvic tilt, clam, child pose, opp arm/leg step and punch; elbow/knees planks   Consulted and Agree with Plan of Care Patient      Patient will benefit from skilled therapeutic intervention in order to improve the  following deficits and impairments:     Visit Diagnosis: Chronic right-sided low back pain, with sciatica presence unspecified  Pain in right hip  Problem List Patient Active Problem List   Diagnosis Date Noted  . Right hip pain 11/29/2016  . Health care maintenance 11/29/2016  . SVD (spontaneous vaginal delivery) 07/23/2014  . Breech extraction, delivered 07/23/2014  . Dichorionic diamniotic twin pregnancy in third trimester 07/22/2014  . GDM, class A1 07/22/2014  . Palpitations 04/15/2014  . Migraine with aura 02/16/2009  . ALLERGIC RHINITIS 02/16/2009  . PAP SMEAR, ABNORMAL 02/16/2009  . BACK STRAIN, LUMBAR 02/16/2009  . PERSONAL HISTORY OF ALLERGY TO PEANUTS 02/16/2009    Correll Denbow C. Tanja Gift PT, DPT 12/25/16 9:32 AM   [facility] [address] [place], [place], [address] Phone: [phone]   Fax:  [phone]  Name: [name] MRN: [id number] Date of Birth: 1976/04/10

## 2016-12-28 ENCOUNTER — Encounter: Payer: Self-pay | Admitting: Physical Therapy

## 2016-12-28 ENCOUNTER — Ambulatory Visit: Payer: 59 | Admitting: Physical Therapy

## 2016-12-28 DIAGNOSIS — M545 Low back pain: Secondary | ICD-10-CM | POA: Diagnosis not present

## 2016-12-28 DIAGNOSIS — G8929 Other chronic pain: Secondary | ICD-10-CM

## 2016-12-28 DIAGNOSIS — M25551 Pain in right hip: Secondary | ICD-10-CM

## 2016-12-28 NOTE — Therapy (Signed)
[place], [place], [address] Phone: [phone]   Fax:  [phone]  Physical Therapy Treatment  Patient Details  Name: [name] MRN: [id number] Date of Birth: 1976-06-12 Referring Provider: Odella Aquas, NP  Encounter Date: 12/28/2016      PT End of Session - 12/28/16 0851    Visit Number 6   Number of Visits 13   Date for PT Re-Evaluation 01/19/17   Authorization Type UHC- 20 visit limit   PT Start Time 0848   PT Stop Time 0942   PT Time Calculation (min) 54 min   Activity Tolerance Patient tolerated treatment well   Behavior During Therapy [facility] for tasks assessed/performed      Past Medical History:  Diagnosis Date  . Abnormal Pap smear   . AMA (advanced maternal age) multigravida 92+   . Breech extraction, delivered 07/23/2014  . Chronic headaches   . Dichorionic diamniotic twin pregnancy in third trimester 07/22/2014  . GDM, class A1 07/22/2014  . Gestational diabetes    diet controlled  . Heart murmur   . Palpitations   . Subfertility of couple   . SVD (spontaneous vaginal delivery) 04/19/2012  . SVD (spontaneous vaginal delivery) 07/23/2014  . Thrombocytopenia complicating pregnancy (HCC)    mild  . Vaginal Pap smear, abnormal     Past Surgical History:  Procedure Laterality Date  . COLPOSCOPY  2009  . HYSTEROSCOPY    . resection of polyp    . VAGINAL DELIVERY  07/23/2014   Procedure: VAGINAL DELIVERY;  Surgeon: Janyth Contes, MD;  Location: [facility];  Service: Obstetrics;;  Twins  . WISDOM TOOTH EXTRACTION      There were no vitals filed for this visit.      Subjective Assessment - 12/28/16 0852    Subjective this is one of my best days but at the end of the day I am hurting by the end of the day on right side and hip.   I have been doing exercises.   Patient Stated Goals pick up children, getting up from floor, manage pain independently, return to exercise  program   Currently in Pain? Yes   Pain Score 4    Pain Location Hip   Pain Orientation Right   Pain Descriptors / Indicators Aching   Pain Onset More than a month ago   Pain Frequency Constant                         [facility] Adult PT Treatment/Exercise - 12/28/16 0854      Self-Care   Self-Care Other Self-Care Comments   Other Self-Care Comments  education on trigger point dry needling and aftercare and precautiians     Lumbar Exercises: Stretches   Piriformis Stretch 3 reps;30 seconds   Piriformis Stretch Limitations right only      Lumbar Exercises: Sidelying   Other Sidelying Lumbar Exercises Right QL left sidelying stretch during dry needling.      Modalities   Modalities Moist Heat     Moist Heat Therapy   Number Minutes Moist Heat 15 Minutes   Moist Heat Location Lumbar Spine;Hip  right side     Manual Therapy   Manual Therapy Myofascial release;Soft tissue mobilization   Soft tissue mobilization IASTYM tool to right lumbar paraspinals QL, and piriformis on the right   Myofascial Release right Quadratus Lumborum and piriformis.  Trigger Point Dry Needling - 12/28/16 0900    Consent Given? Yes   Education Handout Provided Yes   Muscles Treated Upper Body Quadratus Lumborum;Piriformis;Longissimus  right side only twitch response all TDN right only   Muscles Treated Lower Body Piriformis;Gluteus maximus;Gluteus minimus   Longissimus Response Twitch response elicited  l-4 to S1 right only    Gluteus Maximus Response Palpable increased muscle length;Twitch response elicited   Gluteus Minimus Response Twitch response elicited;Palpable increased muscle length   Piriformis Response Twitch response elicited;Palpable increased muscle length                PT Short Term Goals - 12/05/16 1318      PT SHORT TERM GOAL #1   Title Pt will verbalize resting pain <=5/10 to indicate decreasing pain levels by 4/6   Baseline 7/10 at eval    Time 3   Period Weeks   Status New     PT SHORT TERM GOAL #2   Title Pt will demo proper lifting posture from floor to waist height to normalize pressure through lumbopelvic region   Baseline poor mechanics due to pain    Time 3   Period Weeks   Status New           PT Long Term Goals - 12/05/16 1320      PT LONG TERM GOAL #1   Title Pt will be able to lift children SIJ pain <=2/10 by 4/27   Baseline severe pain at eval   Time 6   Period Weeks   Status New     PT LONG TERM GOAL #2   Title Pt will be able to get up and down from the floor without increase in pain to play with children   Baseline severe pain at eval   Time 6   Period Weeks   Status New     PT LONG TERM GOAL #3   Title FOTO to 61% ability to indicate significant improvement in functional ability   Baseline 45% ability at eval   Time 6   Period Weeks   Status New     PT LONG TERM GOAL #4   Title Pt will verbalize beginning return to independent exercise at home for continued overall health and wellness and return to PLOF   Baseline not exercising at eval due to pain   Time 6   Period Weeks   Status New     PT LONG TERM GOAL #5   Title Pt will verbalize knowledge for independence in self management of innominate rotation for future rotation cases   Baseline began educating at eval   Time 6   Period Weeks   Status New               Plan - 12/28/16 1610    Clinical Impression Statement Ms. Beezley, feels better but did have 4/10 pain yesterday in right side hip.  Pt consented to TDN and was closely monitored through out session.  Pt given QL stretch in left sidelying.  Pt with palpable lengthening and localized twitch response in all muscles needled today  Will assess for benefit next visit   Rehab Potential Good   PT Frequency 2x / week   PT Duration 6 weeks   PT Treatment/Interventions ADLs/Self Care Home Management;Cryotherapy;Electrical Stimulation;Iontophoresis 81m/ml  Dexamethasone;Functional mobility training;Stair training;Gait training;Ultrasound;Traction;Moist Heat;Therapeutic activities;Therapeutic exercise;Balance training;Neuromuscular re-education;Patient/family education;Passive range of motion;Manual techniques;Dry needling;Taping   PT Next Visit Plan check rotation, MET PRN, lumbo pelvic  stability, DN when avail- R piriformis, QL, TFL, glut med  QL stretch, Assess dry needling and assess goals   PT Home Exercise Plan stand equal on feet, avoid crossing legs, iso add in supine and seated, QL stretch in door, seated pelvic tilt, clam, child pose, opp arm/leg step and punch; elbow/knees planks   Consulted and Agree with Plan of Care Patient      Patient will benefit from skilled therapeutic intervention in order to improve the following deficits and impairments:  Difficulty walking, Increased muscle spasms, Decreased activity tolerance, Pain, Improper body mechanics, Decreased strength, Postural dysfunction  Visit Diagnosis: Chronic right-sided low back pain, with sciatica presence unspecified  Pain in right hip     Problem List Patient Active Problem List   Diagnosis Date Noted  . Right hip pain 11/29/2016  . Health care maintenance 11/29/2016  . SVD (spontaneous vaginal delivery) 07/23/2014  . Breech extraction, delivered 07/23/2014  . Dichorionic diamniotic twin pregnancy in third trimester 07/22/2014  . GDM, class A1 07/22/2014  . Palpitations 04/15/2014  . Migraine with aura 02/16/2009  . ALLERGIC RHINITIS 02/16/2009  . PAP SMEAR, ABNORMAL 02/16/2009  . BACK STRAIN, LUMBAR 02/16/2009  . PERSONAL HISTORY OF ALLERGY TO PEANUTS 02/16/2009   Voncille Lo, PT Certified Exercise Expert for the Aging Adult  12/28/16 9:32 AM Phone: [phone] Fax: [place] [address] [place], [place], [address] Phone: [phone]   Fax:  [phone]  Name: Amira Podolak MRN: [id number] Date of Birth: 01/11/1976

## 2016-12-28 NOTE — Patient Instructions (Signed)
Trigger Point Dry Needling  . What is Trigger Point Dry Needling (DN)? o DN is a physical therapy technique used to treat muscle pain and dysfunction. Specifically, DN helps deactivate muscle trigger points (muscle knots).  o A thin filiform needle is used to penetrate the skin and stimulate the underlying trigger point. The goal is for a local twitch response (LTR) to occur and for the trigger point to relax. No medication of any kind is injected during the procedure.   . What Does Trigger Point Dry Needling Feel Like?  o The procedure feels different for each individual patient. Some patients report that they do not actually feel the needle enter the skin and overall the process is not painful. Very mild bleeding may occur. However, many patients feel a deep cramping in the muscle in which the needle was inserted. This is the local twitch response.   Marland Kitchen How Will I feel after the treatment? o Soreness is normal, and the onset of soreness may not occur for a few hours. Typically this soreness does not last longer than two days.  o Bruising is uncommon, however; ice can be used to decrease any possible bruising.  o In rare cases feeling tired or nauseous after the treatment is normal. In addition, your symptoms may get worse before they get better, this period will typically not last longer than 24 hours.   . What Can I do After My Treatment? o Increase your hydration by drinking more water for the next 24 hours. o You may place ice or heat on the areas treated that have become sore, however, do not use heat on inflamed or bruised areas. Heat often brings more relief post needling. o You can continue your regular activities, but vigorous activity is not recommended initially after the treatment for 24 hours. o DN is best combined with other physical therapy such as strengthening, stretching, and other therapies.    given handout for Quadratus lumborum  Left sidelying stretch.   [name],  PT Certified Exercise Expert for the Aging Adult  12/28/16 8:56 AM Phone: [phone] Fax: [phone]

## 2017-01-01 ENCOUNTER — Encounter: Payer: Self-pay | Admitting: Physical Therapy

## 2017-01-01 ENCOUNTER — Ambulatory Visit: Payer: 59 | Admitting: Physical Therapy

## 2017-01-01 ENCOUNTER — Telehealth: Payer: Self-pay

## 2017-01-01 DIAGNOSIS — M25551 Pain in right hip: Secondary | ICD-10-CM

## 2017-01-01 DIAGNOSIS — M545 Low back pain: Secondary | ICD-10-CM | POA: Diagnosis not present

## 2017-01-01 DIAGNOSIS — G8929 Other chronic pain: Secondary | ICD-10-CM

## 2017-01-01 NOTE — Therapy (Signed)
[place], [place], [address] Phone: [phone]   Fax:  [phone]  Physical Therapy Treatment  Patient Details  Name: [name] MRN: [id number] Date of Birth: 05/29/1976 Referring Provider: Odella Aquas, NP  Encounter Date: 01/01/2017      PT End of Session - 01/01/17 0852    Visit Number 7   Number of Visits 13   Date for PT Re-Evaluation 01/19/17   Authorization Type U[employer]- 20 visit limit   PT Start Time [phone]  pt arrived late   PT Stop Time 0935   PT Time Calculation (min) 43 min   Activity Tolerance Patient tolerated treatment well   Behavior During Therapy [facility] for tasks assessed/performed      Past Medical History:  Diagnosis Date  . Abnormal Pap smear   . AMA (advanced maternal age) multigravida 54+   . Breech extraction, delivered 07/23/2014  . Chronic headaches   . Dichorionic diamniotic twin pregnancy in third trimester 07/22/2014  . GDM, class A1 07/22/2014  . Gestational diabetes    diet controlled  . Heart murmur   . Palpitations   . Subfertility of couple   . SVD (spontaneous vaginal delivery) 04/19/2012  . SVD (spontaneous vaginal delivery) 07/23/2014  . Thrombocytopenia complicating pregnancy (HCC)    mild  . Vaginal Pap smear, abnormal     Past Surgical History:  Procedure Laterality Date  . COLPOSCOPY  2009  . HYSTEROSCOPY    . resection of polyp    . VAGINAL DELIVERY  07/23/2014   Procedure: VAGINAL DELIVERY;  Surgeon: Janyth Contes, MD;  Location: [facility];  Service: Obstetrics;;  Twins  . WISDOM TOOTH EXTRACTION      There were no vitals filed for this visit.      Subjective Assessment - 01/01/17 0852    Subjective Felt some relief after needling. Feels okay today. Some pain back into shoulder. Rough weekend, was very uncomfortable, feels a little better today. Points to R lateral sacrum, almost feels "untouchable" Felt a grinding sensation while  doing exercises over the weekend.                          [place] Adult PT Treatment/Exercise - 01/01/17 0001      Lumbar Exercises: Stretches   Prone Mid Back Stretch Limitations child pose     Knee/Hip Exercises: Supine   Other Supine Knee/Hip Exercises post pelvic tilt with towel squeeze     Modalities   Modalities Ultrasound     Ultrasound   Ultrasound Location R SIJ surrounding tissues   Ultrasound Parameters 8 min cont 1.0w/cm2   Ultrasound Goals Pain     Manual Therapy   Manual therapy comments SIJ belt placed   Joint Mobilization lat FA gr 4, static and mob with mvmt; adducted AP FA gr 4                PT Education - 01/01/17 1150    Education provided Yes   Education Details rationale for treatments, referral to ortho, centralization of pain   Person(s) Educated Patient   Methods Explanation;Demonstration;Tactile cues;Verbal cues   Comprehension Verbalized understanding;Returned demonstration;Verbal cues required;Tactile cues required;Need further instruction          PT Short Term Goals - 01/01/17 1239      PT SHORT TERM GOAL #1   Title Pt will verbalize resting pain <=5/10 to indicate decreasing pain levels by 4/6  Baseline good after therapy but as days go by pain increases and spreads   Status On-going     PT SHORT TERM GOAL #2   Title Pt will demo proper lifting posture from floor to waist height to normalize pressure through lumbopelvic region   Baseline able to demo with good mechanics    Status Achieved           PT Long Term Goals - 12/05/16 1320      PT LONG TERM GOAL #1   Title Pt will be able to lift children SIJ pain <=2/10 by 4/27   Baseline severe pain at eval   Time 6   Period Weeks   Status New     PT LONG TERM GOAL #2   Title Pt will be able to get up and down from the floor without increase in pain to play with children   Baseline severe pain at eval   Time 6   Period Weeks   Status New     PT  LONG TERM GOAL #3   Title FOTO to 61% ability to indicate significant improvement in functional ability   Baseline 45% ability at eval   Time 6   Period Weeks   Status New     PT LONG TERM GOAL #4   Title Pt will verbalize beginning return to independent exercise at home for continued overall health and wellness and return to PLOF   Baseline not exercising at eval due to pain   Time 6   Period Weeks   Status New     PT LONG TERM GOAL #5   Title Pt will verbalize knowledge for independence in self management of innominate rotation for future rotation cases   Baseline began educating at eval   Time 6   Period Weeks   Status New               Plan - 01/01/17 0934    Clinical Impression Statement Centralized pain to R hip and SIJ, able to point to specific spots of pain rather than along spine and around hip. Felt relief with SIJ belt an dincrease when it was removed, requested she purchase an SI belt. Will contact referring NP and request referral to ortho due to centralizing of pain with visits but not decreasing that centralized pain. No change in weight, history of CA, unrelenting night pain, change in bowel or bladder.    PT Next Visit Plan centralize pain, referral to ortho scheduled?   Consulted and Agree with Plan of Care Patient      Patient will benefit from skilled therapeutic intervention in order to improve the following deficits and impairments:     Visit Diagnosis: Chronic right-sided low back pain, with sciatica presence unspecified  Pain in right hip     Problem List Patient Active Problem List   Diagnosis Date Noted  . Right hip pain 11/29/2016  . Health care maintenance 11/29/2016  . SVD (spontaneous vaginal delivery) 07/23/2014  . Breech extraction, delivered 07/23/2014  . Dichorionic diamniotic twin pregnancy in third trimester 07/22/2014  . GDM, class A1 07/22/2014  . Palpitations 04/15/2014  . Migraine with aura 02/16/2009  . ALLERGIC  RHINITIS 02/16/2009  . PAP SMEAR, ABNORMAL 02/16/2009  . BACK STRAIN, LUMBAR 02/16/2009  . PERSONAL HISTORY OF ALLERGY TO PEANUTS 02/16/2009    [name] C. Domitila Stetler PT, DPT 01/01/17 12:42 PM   [facility] [address] [place], [place], [address] Phone:  [phone]   Fax:  [phone]  Name: Niamh Rada MRN: [id number] Date of Birth: 1976/09/15

## 2017-01-01 NOTE — Telephone Encounter (Signed)
Selinda Eon, PT with [facility] called requesting a referral to ortho for pt.  [name] states that she has seen the patient for 7 visits now for hip/SI pain and she feels that the patient needs some additional imaging.  Bennie Pierini that I will place referral and if pt has not heard from either ortho or our office by 01/05/17 to call the office for f/u.  Charyl Bigger, CMA

## 2017-01-04 ENCOUNTER — Encounter: Payer: Self-pay | Admitting: Physical Therapy

## 2017-01-04 ENCOUNTER — Ambulatory Visit: Payer: 59 | Admitting: Physical Therapy

## 2017-01-04 DIAGNOSIS — M545 Low back pain: Secondary | ICD-10-CM | POA: Diagnosis not present

## 2017-01-04 DIAGNOSIS — G8929 Other chronic pain: Secondary | ICD-10-CM

## 2017-01-04 DIAGNOSIS — M25551 Pain in right hip: Secondary | ICD-10-CM

## 2017-01-04 NOTE — Therapy (Signed)
[place], [place], [address] Phone: [phone]   Fax:  [phone]  Physical Therapy Treatment  Patient Details  Name: [name] MRN: [id number] Date of Birth: October 10, 1975 Referring Provider: Odella Aquas, NP  Encounter Date: 01/04/2017      PT End of Session - 01/04/17 0850    Visit Number 8   Number of Visits 13   Date for PT Re-Evaluation 01/19/17   Authorization Type UHC- 20 visit limit   PT Start Time 0845   PT Stop Time 0940   PT Time Calculation (min) 55 min   Activity Tolerance Patient tolerated treatment well   Behavior During Therapy [facility] for tasks assessed/performed      Past Medical History:  Diagnosis Date  . Abnormal Pap smear   . AMA (advanced maternal age) multigravida 40+   . Breech extraction, delivered 07/23/2014  . Chronic headaches   . Dichorionic diamniotic twin pregnancy in third trimester 07/22/2014  . GDM, class A1 07/22/2014  . Gestational diabetes    diet controlled  . Heart murmur   . Palpitations   . Subfertility of couple   . SVD (spontaneous vaginal delivery) 04/19/2012  . SVD (spontaneous vaginal delivery) 07/23/2014  . Thrombocytopenia complicating pregnancy (HCC)    mild  . Vaginal Pap smear, abnormal     Past Surgical History:  Procedure Laterality Date  . COLPOSCOPY  2009  . HYSTEROSCOPY    . resection of polyp    . VAGINAL DELIVERY  07/23/2014   Procedure: VAGINAL DELIVERY;  Surgeon: Janyth Contes, MD;  Location: [facility];  Service: Obstetrics;;  Twins  . WISDOM TOOTH EXTRACTION      There were no vitals filed for this visit.      Subjective Assessment - 01/04/17 0852    Subjective I hurt really bad last night.  about an 8/10, I felt good for 3 days after TDN but on Sunday the 4th day. I was unable to do anything but sit due to pain..  I  would like to try dry needling again.  I have an appt with MD on Tuesday April 17th.  I did feel  better after [name] worked on me but it doesnt last.    Patient Stated Goals pick up children, getting up from floor, manage pain independently, return to exercise program   Currently in Pain? Yes   Pain Score 4    Pain Location Hip   Pain Orientation Right   Pain Descriptors / Indicators Aching   Pain Onset More than a month ago   Pain Frequency Constant                         [facility] Adult PT Treatment/Exercise - 01/04/17 0854      Lumbar Exercises: Stretches   Prone Mid Back Stretch Limitations child pose   Piriformis Stretch 3 reps;30 seconds  corrected techniques 2 in supine, one in sitting   Piriformis Stretch Limitations right only , stretches feel good but supine best     Lumbar Exercises: Sidelying   Other Sidelying Lumbar Exercises Right QL left sidelying stretch during dry needling.      Knee/Hip Exercises: Supine   Other Supine Knee/Hip Exercises post pelvic tilt with towel squeeze     Modalities   Modalities Moist Heat     Moist Heat Therapy   Number Minutes Moist Heat 15 Minutes   Moist Heat Location Lumbar Spine;Hip  right side     Manual Therapy   Manual Therapy Myofascial release;Soft tissue mobilization   Soft tissue mobilization IASTYM tool to right lumbar paraspinals QL, and piriformis on the right   Myofascial Release right Quadratus Lumborum and piriformis.right lumbar paraspinals IR/eR assisted stretch in prone with myofascial release          Trigger Point Dry Needling - 01/04/17 0904    Consent Given? Yes   Education Handout Provided No  previously given   Muscles Treated Upper Body Quadratus Lumborum;Piriformis;Gluteus minimus;Gluteus maximus;Longissimus  right erector spinae T10 to L5/s1   Muscles Treated Lower Body Piriformis;Gluteus minimus;Gluteus maximus  right side only for TDN   Longissimus Response Twitch response elicited;Palpable increased muscle length   Gluteus Maximus Response Twitch response elicited;Palpable  increased muscle length   Gluteus Minimus Response Twitch response elicited;Palpable increased muscle length   Piriformis Response Twitch response elicited;Palpable increased muscle length                PT Short Term Goals - 01/01/17 1239      PT SHORT TERM GOAL #1   Title Pt will verbalize resting pain <=5/10 to indicate decreasing pain levels by 4/6   Baseline good after therapy but as days go by pain increases and spreads   Status On-going     PT SHORT TERM GOAL #2   Title Pt will demo proper lifting posture from floor to waist height to normalize pressure through lumbopelvic region   Baseline able to demo with good mechanics    Status Achieved           PT Long Term Goals - 12/05/16 1320      PT LONG TERM GOAL #1   Title Pt will be able to lift children SIJ pain <=2/10 by 4/27   Baseline severe pain at eval   Time 6   Period Weeks   Status New     PT LONG TERM GOAL #2   Title Pt will be able to get up and down from the floor without increase in pain to play with children   Baseline severe pain at eval   Time 6   Period Weeks   Status New     PT LONG TERM GOAL #3   Title FOTO to 61% ability to indicate significant improvement in functional ability   Baseline 45% ability at eval   Time 6   Period Weeks   Status New     PT LONG TERM GOAL #4   Title Pt will verbalize beginning return to independent exercise at home for continued overall health and wellness and return to PLOF   Baseline not exercising at eval due to pain   Time 6   Period Weeks   Status New     PT LONG TERM GOAL #5   Title Pt will verbalize knowledge for independence in self management of innominate rotation for future rotation cases   Baseline began educating at eval   Time 6   Period Weeks   Status New               Plan - 01/04/17 1696    Clinical Impression Statement Pt with 8/10 centralized pain with pain radiating into Right buttock. Pt states she feels much better  after PT but pain still returns to intense level.  Pt has appt with MD on Tuesday, April 17th  to investigate ongoing intense centralized pain even while being compliant with exercises.  Pt is  weary of trying to do all she needs to do and would like answers to ongoing pain an intensity.  NO change in weight, history of CA , unrelenting night pain or change in bowel bladder noted..  Pt will need MD note this coming Monday PT appt to send to MD before appt Tuesday   Rehab Potential Good   PT Frequency 2x / week   PT Duration 6 weeks   PT Treatment/Interventions ADLs/Self Care Home Management;Cryotherapy;Electrical Stimulation;Iontophoresis 41m/ml Dexamethasone;Functional mobility training;Stair training;Gait training;Ultrasound;Traction;Moist Heat;Therapeutic activities;Therapeutic exercise;Balance training;Neuromuscular re-education;Patient/family education;Passive range of motion;Manual techniques;Dry needling;Taping   PT Next Visit Plan centralize pain, referral to ortho scheduled, [facility] Doctor progress note, MD appt Tuesday April 17th. FOTO was emailed to pt. on 01-04-17   PT Home Exercise Plan stand equal on feet, avoid crossing legs, iso add in supine and seated, QL stretch in door, seated pelvic tilt, clam, child pose, opp arm/leg step and punch; elbow/knees planks   Consulted and Agree with Plan of Care Patient      Patient will benefit from skilled therapeutic intervention in order to improve the following deficits and impairments:  Difficulty walking, Increased muscle spasms, Decreased activity tolerance, Pain, Improper body mechanics, Decreased strength, Postural dysfunction  Visit Diagnosis: Chronic right-sided low back pain, with sciatica presence unspecified  Pain in right hip     Problem List Patient Active Problem List   Diagnosis Date Noted  . Right hip pain 11/29/2016  . Health care maintenance 11/29/2016  . SVD (spontaneous vaginal delivery) 07/23/2014  . Breech extraction,  delivered 07/23/2014  . Dichorionic diamniotic twin pregnancy in third trimester 07/22/2014  . GDM, class A1 07/22/2014  . Palpitations 04/15/2014  . Migraine with aura 02/16/2009  . ALLERGIC RHINITIS 02/16/2009  . PAP SMEAR, ABNORMAL 02/16/2009  . BACK STRAIN, LUMBAR 02/16/2009  . PERSONAL HISTORY OF ALLERGY TO PEANUTS 02/16/2009    LVoncille Lo PT Certified Exercise Expert for the Aging Adult  01/04/17 9:35 AM Phone: [phone]: 3[facility] [address]Phone: [phone]  Fax:  [phone] Name: Lindsay SawatzkyMRN: [id number] of Birth: [birth year]

## 2017-01-08 ENCOUNTER — Ambulatory Visit: Payer: 59 | Admitting: Physical Therapy

## 2017-01-08 ENCOUNTER — Encounter: Payer: Self-pay | Admitting: Physical Therapy

## 2017-01-08 DIAGNOSIS — G8929 Other chronic pain: Secondary | ICD-10-CM

## 2017-01-08 DIAGNOSIS — M25551 Pain in right hip: Secondary | ICD-10-CM

## 2017-01-08 DIAGNOSIS — M545 Low back pain: Secondary | ICD-10-CM | POA: Diagnosis not present

## 2017-01-08 NOTE — Therapy (Signed)
[place], [place], [address] Phone: [phone]   Fax:  [phone]  Physical Therapy Treatment  Patient Details  Name: [name] MRN: [id number] Date of Birth: [birth year] Referring Provider: Odella Aquas, NP  Encounter Date: 01/08/2017      PT End of Session - 01/08/17 1102    Visit Number 9   Number of Visits 13   Date for PT Re-Evaluation 01/19/17   Authorization Type UHC- 20 visit limit   PT Start Time 1102   PT Stop Time 1138   PT Time Calculation (min) 36 min   Activity Tolerance Patient tolerated treatment well   Behavior During Therapy [facility] for tasks assessed/performed      Past Medical History:  Diagnosis Date  . Abnormal Pap smear   . AMA (advanced maternal age) multigravida 51+   . Breech extraction, delivered 07/23/2014  . Chronic headaches   . Dichorionic diamniotic twin pregnancy in third trimester 07/22/2014  . GDM, class A1 07/22/2014  . Gestational diabetes    diet controlled  . Heart murmur   . Palpitations   . Subfertility of couple   . SVD (spontaneous vaginal delivery) 04/19/2012  . SVD (spontaneous vaginal delivery) 07/23/2014  . Thrombocytopenia complicating pregnancy (HCC)    mild  . Vaginal Pap smear, abnormal     Past Surgical History:  Procedure Laterality Date  . COLPOSCOPY  2009  . HYSTEROSCOPY    . resection of polyp    . VAGINAL DELIVERY  07/23/2014   Procedure: VAGINAL DELIVERY;  Surgeon: Janyth Contes, MD;  Location: [facility];  Service: Obstetrics;;  Twins  . WISDOM TOOTH EXTRACTION      There were no vitals filed for this visit.      Subjective Assessment - 01/08/17 1102    Subjective Reports feeling "pinchy" at the R SIJ. Needling was helpful but pinch did not change. pt has SIJ belt at home and is able to decrease concordant pain when it increases at home.    Patient Stated Goals pick up children, getting up from floor, manage pain  independently, return to exercise program   Currently in Pain? Yes   Pain Score 4    Pain Location --  SIJ   Pain Orientation Right   Pain Descriptors / Indicators --  pinching                         [facility] Adult PT Treatment/Exercise - 01/08/17 0001      Knee/Hip Exercises: Aerobic   Stationary Bike 5 min L5     Manual Therapy   Joint Mobilization L sacral tilt Gr 4   Soft tissue mobilization roller R ITB, hip external rotators, quads   Myofascial Release L QL; R piriformis, R VL                PT Education - 01/08/17 1107    Education provided Yes   Education Details rationale for manual treatment, SIJ assessment-explained findings, peripheralization/centralization   Person(s) Educated Patient   Methods Explanation;Demonstration;Tactile cues;Verbal cues   Comprehension Verbalized understanding;Returned demonstration;Verbal cues required;Tactile cues required;Need further instruction          PT Short Term Goals - 01/01/17 1239      PT SHORT TERM GOAL #1   Title Pt will verbalize resting pain <=5/10 to indicate decreasing pain levels by 4/6   Baseline good after therapy but as days go by pain  increases and spreads   Status On-going     PT SHORT TERM GOAL #2   Title Pt will demo proper lifting posture from floor to waist height to normalize pressure through lumbopelvic region   Baseline able to demo with good mechanics    Status Achieved           PT Long Term Goals - 12/05/16 1320      PT LONG TERM GOAL #1   Title Pt will be able to lift children SIJ pain <=2/10 by 4/27   Baseline severe pain at eval   Time 6   Period Weeks   Status New     PT LONG TERM GOAL #2   Title Pt will be able to get up and down from the floor without increase in pain to play with children   Baseline severe pain at eval   Time 6   Period Weeks   Status New     PT LONG TERM GOAL #3   Title FOTO to 61% ability to indicate significant improvement in  functional ability   Baseline 45% ability at eval   Time 6   Period Weeks   Status New     PT LONG TERM GOAL #4   Title Pt will verbalize beginning return to independent exercise at home for continued overall health and wellness and return to PLOF   Baseline not exercising at eval due to pain   Time 6   Period Weeks   Status New     PT LONG TERM GOAL #5   Title Pt will verbalize knowledge for independence in self management of innominate rotation for future rotation cases   Baseline began educating at eval   Time 6   Period Weeks   Status New               Plan - 01/08/17 1141    Clinical Impression Statement Notable hypermobility of R SIJ V L in palpation, fwd flexion and single leg stance. Concordant pain recreated at SIJ. Decreased peripheral myofascial pain to centralize pain to SIJ. Mobilized L SIJ to decrease imbalance of mobility/stability R to L. Pt will be visiting MD tomorrow.    PT Next Visit Plan how did MD visit go? possible evaluation for L innom upslip   PT Home Exercise Plan stand equal on feet, avoid crossing legs, iso add in supine and seated, QL stretch in door, seated pelvic tilt, clam, child pose, opp arm/leg step and punch; elbow/knees planks; lumbo pelvic stability   Consulted and Agree with Plan of Care Patient      Patient will benefit from skilled therapeutic intervention in order to improve the following deficits and impairments:     Visit Diagnosis: Chronic right-sided low back pain, with sciatica presence unspecified  Pain in right hip     Problem List Patient Active Problem List   Diagnosis Date Noted  . Right hip pain 11/29/2016  . Health care maintenance 11/29/2016  . SVD (spontaneous vaginal delivery) 07/23/2014  . Breech extraction, delivered 07/23/2014  . Dichorionic diamniotic twin pregnancy in third trimester 07/22/2014  . GDM, class A1 07/22/2014  . Palpitations 04/15/2014  . Migraine with aura 02/16/2009  . ALLERGIC  RHINITIS 02/16/2009  . PAP SMEAR, ABNORMAL 02/16/2009  . BACK STRAIN, LUMBAR 02/16/2009  . PERSONAL HISTORY OF ALLERGY TO PEANUTS 02/16/2009   Stepan Verrette C. Davonna Ertl PT, DPT 01/08/17 1:02 PM   [facility] [place],  [place], [address] Phone: [phone]   Fax:  [phone]  Name: [name] MRN: [id number] Date of Birth: November 01, 1975

## 2017-01-09 ENCOUNTER — Ambulatory Visit (INDEPENDENT_AMBULATORY_CARE_PROVIDER_SITE_OTHER): Payer: 59

## 2017-01-09 ENCOUNTER — Encounter (INDEPENDENT_AMBULATORY_CARE_PROVIDER_SITE_OTHER): Payer: Self-pay | Admitting: Orthopaedic Surgery

## 2017-01-09 ENCOUNTER — Ambulatory Visit: Payer: 59 | Admitting: Adult Health

## 2017-01-09 ENCOUNTER — Ambulatory Visit (INDEPENDENT_AMBULATORY_CARE_PROVIDER_SITE_OTHER): Payer: 59 | Admitting: Orthopaedic Surgery

## 2017-01-09 DIAGNOSIS — M545 Low back pain: Secondary | ICD-10-CM | POA: Diagnosis not present

## 2017-01-09 DIAGNOSIS — M25551 Pain in right hip: Secondary | ICD-10-CM

## 2017-01-09 DIAGNOSIS — G8929 Other chronic pain: Secondary | ICD-10-CM | POA: Diagnosis not present

## 2017-01-09 MED ORDER — DICLOFENAC SODIUM 75 MG PO TBEC: 75.0000 mg | DELAYED_RELEASE_TABLET | Freq: Two times a day (BID) | ORAL | 2 refills | Status: DC

## 2017-01-09 NOTE — Progress Notes (Signed)
Office Visit Note   Patient: [name]           Date of Birth: 1976/07/04           MRN: [id number] Visit Date: 01/09/2017              Requested by: Odella Aquas, NP [place], [place] [address] PCP: Lillard Anes, NP   Assessment & Plan: Visit Diagnoses:  1. Pain in right hip   2. Chronic bilateral low back pain, with sciatica presence unspecified     Plan: Impression is right sacroiliac dysfunction. However recommend MRI of the lumbar spine to rule out about structural abnormality. Follow-up after MRI. Questions encouraged and answered.  Follow-Up Instructions: Return in about 2 weeks (around 01/23/2017).   Orders:  Orders Placed This Encounter  Procedures  . XR HIP UNILAT W OR W/O PELVIS 2-3 VIEWS RIGHT  . XR Lumbar Spine 2-3 Views  . MR Lumbar Spine w/o contrast   Meds ordered this encounter  Medications  . diclofenac (VOLTAREN) 75 MG EC tablet    Sig: Take 1 tablet (75 mg total) by mouth 2 (two) times daily.    Dispense:  30 tablet    Refill:  2      Procedures: No procedures performed   Clinical Data: No additional findings.   Subjective: Chief Complaint  Patient presents with  . Right Hip - Pain    Patient is a 41 year old female who has had 2 and half years of right buttock pain that sometimes radiates into the leg and into the groin. She endorses weakness tightness and burning. The pain will cause her leg to give out. She denies any limping. The pain is mainly in her lower back and buttock region. Lateral hip is nontender or painful. She has done extensive physical therapy without any significant relief.    Review of Systems  Constitutional: Negative.   HENT: Negative.   Eyes: Negative.   Respiratory: Negative.   Cardiovascular: Negative.   Endocrine: Negative.   Musculoskeletal: Negative.   Neurological: Negative.   Hematological: Negative.   Psychiatric/Behavioral: Negative.   All other systems reviewed and are  negative.    Objective: Vital Signs: There were no vitals taken for this visit.  Physical Exam  Constitutional: She is oriented to person, place, and time. She appears well-developed and well-nourished.  HENT:  Head: Normocephalic and atraumatic.  Eyes: EOM are normal.  Neck: Neck supple.  Pulmonary/Chest: Effort normal.  Abdominal: Soft.  Neurological: She is alert and oriented to person, place, and time.  Skin: Skin is warm. Capillary refill takes less than 2 seconds.  Psychiatric: She has a normal mood and affect. Her behavior is normal. Judgment and thought content normal.  Nursing note and vitals reviewed.   Ortho Exam Right hip exam shows good range of motion of the hip without significant pain. Her SI joint is tender to palpation. Positive Faber, positive axial compression, equivocal Stinchfield. No sciatic tension signs. Specialty Comments:  No specialty comments available.  Imaging: Xr Hip Unilat W Or W/o Pelvis 2-3 Views Right  Result Date: 01/09/2017 No acute or structural abnormalities  Xr Lumbar Spine 2-3 Views  Result Date: 01/09/2017 No significant deformities.    PMFS History: Patient Active Problem List   Diagnosis Date Noted  . Right hip pain 11/29/2016  . Health care maintenance 11/29/2016  . SVD (spontaneous vaginal delivery) 07/23/2014  . Breech extraction, delivered 07/23/2014  . Dichorionic diamniotic twin pregnancy  in third trimester 07/22/2014  . GDM, class A1 07/22/2014  . Palpitations 04/15/2014  . Migraine with aura 02/16/2009  . ALLERGIC RHINITIS 02/16/2009  . PAP SMEAR, ABNORMAL 02/16/2009  . BACK STRAIN, LUMBAR 02/16/2009  . PERSONAL HISTORY OF ALLERGY TO PEANUTS 02/16/2009   Past Medical History:  Diagnosis Date  . Abnormal Pap smear   . AMA (advanced maternal age) multigravida 50+   . Breech extraction, delivered 07/23/2014  . Chronic headaches   . Dichorionic diamniotic twin pregnancy in third trimester 07/22/2014  . GDM,  class A1 07/22/2014  . Gestational diabetes    diet controlled  . Heart murmur   . Palpitations   . Subfertility of couple   . SVD (spontaneous vaginal delivery) 04/19/2012  . SVD (spontaneous vaginal delivery) 07/23/2014  . Thrombocytopenia complicating pregnancy (HCC)    mild  . Vaginal Pap smear, abnormal     Family History  Problem Relation Age of Onset  . Arthritis Mother   . Depression Mother   . Hypertension Mother   . Hypothyroidism Mother   . Migraines Mother   . Miscarriages / [place] Mother   . Hypertension Sister   . Hypothyroidism Sister   . Migraines Sister   . Healthy Father   . Coronary artery disease Maternal Grandfather   . Arthritis Maternal Grandfather   . Heart disease Maternal Grandfather   . Hypertension Maternal Grandfather   . Stroke Maternal Grandfather   . Coronary artery disease Paternal Grandfather   . Arthritis Paternal Grandfather   . Heart disease Paternal Grandfather   . Hypertension Paternal Grandfather   . Stroke Paternal Grandfather   . Cancer Maternal Aunt     breast and cervical  . Depression Maternal Uncle   . Migraines Maternal Grandmother   . Arthritis Maternal Grandmother   . Hypertension Maternal Grandmother   . Stroke Maternal Grandmother   . Arthritis Paternal Grandmother   . Hypertension Paternal Grandmother   . Stroke Paternal Grandmother   . Cancer Maternal Aunt     leaukemia    Past Surgical History:  Procedure Laterality Date  . COLPOSCOPY  2009  . HYSTEROSCOPY    . resection of polyp    . VAGINAL DELIVERY  07/23/2014   Procedure: VAGINAL DELIVERY;  Surgeon: Janyth Contes, MD;  Location: [facility];  Service: Obstetrics;;  Twins  . WISDOM TOOTH EXTRACTION     Social History   Occupational History  . Nurse, learning disability    Social History Main Topics  . Smoking status: Former Smoker    Packs/day: 0.25    Years: 2.00    Quit date: 09/25/1998  . Smokeless tobacco: Never Used  . Alcohol use No  . Drug  use: No  . Sexual activity: Yes    Birth control/ protection: None, IUD

## 2017-01-11 ENCOUNTER — Ambulatory Visit: Payer: 59 | Admitting: Physical Therapy

## 2017-01-15 ENCOUNTER — Ambulatory Visit: Payer: 59 | Admitting: Physical Therapy

## 2017-01-18 ENCOUNTER — Ambulatory Visit: Payer: 59 | Admitting: Physical Therapy

## 2017-01-20 ENCOUNTER — Ambulatory Visit
Admission: RE | Admit: 2017-01-20 | Discharge: 2017-01-20 | Disposition: A | Discharge status: Home / Self Care | Payer: 59 | Source: Ambulatory Visit | Attending: Orthopaedic Surgery | Admitting: Orthopaedic Surgery

## 2017-01-20 DIAGNOSIS — G8929 Other chronic pain: Secondary | ICD-10-CM

## 2017-01-20 DIAGNOSIS — M545 Low back pain: Principal | ICD-10-CM

## 2017-01-22 ENCOUNTER — Ambulatory Visit: Payer: 59 | Admitting: Physical Therapy

## 2017-01-23 ENCOUNTER — Ambulatory Visit (INDEPENDENT_AMBULATORY_CARE_PROVIDER_SITE_OTHER): Payer: 59 | Admitting: Orthopaedic Surgery

## 2017-01-23 ENCOUNTER — Encounter (INDEPENDENT_AMBULATORY_CARE_PROVIDER_SITE_OTHER): Payer: Self-pay | Admitting: Orthopaedic Surgery

## 2017-01-23 DIAGNOSIS — M25551 Pain in right hip: Secondary | ICD-10-CM | POA: Diagnosis not present

## 2017-01-23 NOTE — Progress Notes (Signed)
Office Visit Note   Patient: [name]           Date of Birth: 01/12/1976           MRN: [id number] Visit Date: 01/23/2017              Requested by: Odella Aquas, NP [place], [place] [address] PCP: Lillard Anes, NP   Assessment & Plan: Visit Diagnoses:  1. Pain in right hip     Plan: MRI is essentially normal. At this point I feel that she likely has SI joint dysfunction on the right side. I will refer her to Dr. Ernestina Patches for a steroid injection. Follow up with me as needed. Questions encouraged and answered.  Follow-Up Instructions: Return if symptoms worsen or fail to improve.   Orders:  No orders of the defined types were placed in this encounter.  No orders of the defined types were placed in this encounter.     Procedures: No procedures performed   Clinical Data: No additional findings.   Subjective: Chief Complaint  Patient presents with  . Lower Back - Follow-up    Patient follows up today for right hip and low back pain. She had a MRI done recently she is here to review this. She's been doing physical therapy and deep tissue massages. She feels about the same.    Review of Systems   Objective: Vital Signs: There were no vitals taken for this visit.  Physical Exam  Ortho Exam Exam is stable. Specialty Comments:  No specialty comments available.  Imaging: No results found.   PMFS History: Patient Active Problem List   Diagnosis Date Noted  . Pain in right hip 11/29/2016  . Health care maintenance 11/29/2016  . SVD (spontaneous vaginal delivery) 07/23/2014  . Breech extraction, delivered 07/23/2014  . Dichorionic diamniotic twin pregnancy in third trimester 07/22/2014  . GDM, class A1 07/22/2014  . Palpitations 04/15/2014  . Migraine with aura 02/16/2009  . ALLERGIC RHINITIS 02/16/2009  . PAP SMEAR, ABNORMAL 02/16/2009  . BACK STRAIN, LUMBAR 02/16/2009  . PERSONAL HISTORY OF ALLERGY TO PEANUTS 02/16/2009   Past  Medical History:  Diagnosis Date  . Abnormal Pap smear   . AMA (advanced maternal age) multigravida 54+   . Breech extraction, delivered 07/23/2014  . Chronic headaches   . Dichorionic diamniotic twin pregnancy in third trimester 07/22/2014  . GDM, class A1 07/22/2014  . Gestational diabetes    diet controlled  . Heart murmur   . Palpitations   . Subfertility of couple   . SVD (spontaneous vaginal delivery) 04/19/2012  . SVD (spontaneous vaginal delivery) 07/23/2014  . Thrombocytopenia complicating pregnancy (HCC)    mild  . Vaginal Pap smear, abnormal     Family History  Problem Relation Age of Onset  . Arthritis Mother   . Depression Mother   . Hypertension Mother   . Hypothyroidism Mother   . Migraines Mother   . Miscarriages / [place] Mother   . Hypertension Sister   . Hypothyroidism Sister   . Migraines Sister   . Healthy Father   . Coronary artery disease Maternal Grandfather   . Arthritis Maternal Grandfather   . Heart disease Maternal Grandfather   . Hypertension Maternal Grandfather   . Stroke Maternal Grandfather   . Coronary artery disease Paternal Grandfather   . Arthritis Paternal Grandfather   . Heart disease Paternal Grandfather   . Hypertension Paternal Grandfather   . Stroke Paternal Grandfather   .  Cancer Maternal Aunt     breast and cervical  . Depression Maternal Uncle   . Migraines Maternal Grandmother   . Arthritis Maternal Grandmother   . Hypertension Maternal Grandmother   . Stroke Maternal Grandmother   . Arthritis Paternal Grandmother   . Hypertension Paternal Grandmother   . Stroke Paternal Grandmother   . Cancer Maternal Aunt     leaukemia    Past Surgical History:  Procedure Laterality Date  . COLPOSCOPY  2009  . HYSTEROSCOPY    . resection of polyp    . VAGINAL DELIVERY  07/23/2014   Procedure: VAGINAL DELIVERY;  Surgeon: Janyth Contes, MD;  Location: [facility];  Service: Obstetrics;;  Twins  . WISDOM TOOTH EXTRACTION      Social History   Occupational History  . Nurse, learning disability    Social History Main Topics  . Smoking status: Former Smoker    Packs/day: 0.25    Years: 2.00    Quit date: 09/25/1998  . Smokeless tobacco: Never Used  . Alcohol use No  . Drug use: No  . Sexual activity: Yes    Birth control/ protection: None, IUD

## 2017-01-25 ENCOUNTER — Ambulatory Visit: Payer: 59 | Attending: Adult Health | Admitting: Physical Therapy

## 2017-01-25 DIAGNOSIS — M25551 Pain in right hip: Secondary | ICD-10-CM

## 2017-01-25 DIAGNOSIS — M545 Low back pain: Secondary | ICD-10-CM | POA: Insufficient documentation

## 2017-01-25 DIAGNOSIS — G8929 Other chronic pain: Secondary | ICD-10-CM | POA: Insufficient documentation

## 2017-01-25 NOTE — Therapy (Signed)
[place], [place], [address] Phone: [phone]   Fax:  [phone]  Physical Therapy Treatment/ Recertification  Patient Details  Name: [name] MRN: [id number] Date of Birth: [birth year] Referring Provider: Odella Aquas, NP  Encounter Date: 01/25/2017      PT End of Session - 01/25/17 0852    Visit Number 10   Number of Visits 13   Date for PT Re-Evaluation 03/08/17   Authorization Type [employer]- 20 visit limit   PT Start Time 0850   PT Stop Time 0945   PT Time Calculation (min) 55 min   Activity Tolerance Patient tolerated treatment well   Behavior During Therapy [facility] for tasks assessed/performed      Past Medical History:  Diagnosis Date  . Abnormal Pap smear   . AMA (advanced maternal age) multigravida 46+   . Breech extraction, delivered 07/23/2014  . Chronic headaches   . Dichorionic diamniotic twin pregnancy in third trimester 07/22/2014  . GDM, class A1 07/22/2014  . Gestational diabetes    diet controlled  . Heart murmur   . Palpitations   . Subfertility of couple   . SVD (spontaneous vaginal delivery) 04/19/2012  . SVD (spontaneous vaginal delivery) 07/23/2014  . Thrombocytopenia complicating pregnancy (HCC)    mild  . Vaginal Pap smear, abnormal     Past Surgical History:  Procedure Laterality Date  . COLPOSCOPY  2009  . HYSTEROSCOPY    . resection of polyp    . VAGINAL DELIVERY  07/23/2014   Procedure: VAGINAL DELIVERY;  Surgeon: Janyth Contes, MD;  Location: [facility];  Service: Obstetrics;;  Twins  . WISDOM TOOTH EXTRACTION      There were no vitals filed for this visit.      Subjective Assessment - 01/25/17 0856    Subjective I was walking with husband two FRidays ago and I was barely able to continue.  The MD took x ray and MRI and there was nothing really diagnostic to report except for minor arthritis.  MD agreed with SIJ pain and Lindsay. [place] is scheduled for  May 9 SIJ injection. MD would like to continue PT for SIJ stabilization.    Patient Stated Goals pick up children, getting up from floor, manage pain independently, return to exercise program   Currently in Pain? Yes   Pain Score 4    Pain Location --  SIJ   Pain Orientation Right   Pain Descriptors / Indicators --  pinching and menstrual cramps   Pain Type Chronic pain   Pain Radiating Towards right Lateral thigh   Pain Onset More than a month ago   Pain Frequency Constant   Aggravating Factors  getting up from the floor and picking up twins   Pain Relieving Factors heat , childs pose and quadratus lumborum stretch            [facility] PT Assessment - 01/25/17 0906      Observation/Other Assessments   Focus on Therapeutic Outcomes (FOTO)  45% ability   Same as on eval worsened from 4/12 goal 61%     Sensation   Additional Comments WFL     AROM   Overall AROM  Within functional limits for tasks performed     Strength   Overall Strength Deficits   Right Hip Flexion 4+/5   Right Hip Extension 4/5   Right Hip ABduction 4/5   Left Hip Flexion 5/5   Left Hip Extension 4+/5  Left Hip ABduction 4+/5     Palpation   SI assessment  TTP R SI; right anterior innom rotation   Palpation comment TTP R piriformis, QL elevatied                     [facility] Adult PT Treatment/Exercise - 01/25/17 0906      Posture/Postural Control   Posture Comments Right hip elevation significant with anterior inominate rotation on right      Modalities   Modalities Moist Heat     Moist Heat Therapy   Number Minutes Moist Heat 15 Minutes   Moist Heat Location Lumbar Spine;Hip;Other (comment)  right side and R SIJ     Manual Therapy   Manual Therapy Myofascial release;Soft tissue mobilization   Joint Mobilization L sacral tilt Gr 4   Soft tissue mobilization IASTYM tool to right lumbar paraspinals QL, and piriformis on the right   Myofascial Release right Quadratus Lumborum and  piriformis.right lumbar paraspinals IR/eR assisted stretch in prone with myofascial release   Muscle Energy Technique L post/R ant innom rotation, slight upslip of left responds to long arm distraction and MET   edge of bed          Trigger Point Dry Needling - 01/25/17 0914    Consent Given? Yes   Muscles Treated Upper Body Quadratus Lumborum;Gluteus minimus;Gluteus maximus;Piriformis  right side only   Muscles Treated Lower Body Piriformis  right side only for all TDN   Longissimus Response Twitch response elicited;Palpable increased muscle length   Gluteus Maximus Response Twitch response elicited;Palpable increased muscle length   Gluteus Minimus Response Twitch response elicited;Palpable increased muscle length   Piriformis Response Twitch response elicited;Palpable increased muscle length                PT Short Term Goals - 01/25/17 0928      PT SHORT TERM GOAL #1   Title Pt will verbalize resting pain <=5/10 to indicate decreasing pain levels by 4/6   Baseline Pt pain is never better than 4/5 out of 10   Time 3   Period Weeks   Status Partially Met     PT SHORT TERM GOAL #2   Title Pt will demo proper lifting posture from floor to waist height to normalize pressure through lumbopelvic region   Baseline pt aware of good body mechanics and tries to be consistent about use   Time 3   Period Weeks   Status Achieved           PT Long Term Goals - 01/25/17 4854      PT LONG TERM GOAL #1   Title Pt will be able to lift children SIJ pain <=2/10 by 6/14   Baseline 5/10 pain today at re evaluation,    Time 6   Period Weeks   Status On-going     PT LONG TERM GOAL #2   Title Pt will be able to get up and down from the floor without increase in pain to play with children  by 6/14   Baseline pain level 5/10 on pt report, and stiffened spine  on pt report   Time 6   Period Weeks   Status On-going     PT LONG TERM GOAL #3   Title FOTO to 61% ability to  indicate significant improvement in functional ability by 6/14   Baseline 45% ability at eval and at todays reevaluation  was 56% on 4-12.  Pt has declinced  Time 6   Period Weeks   Status Not Met     PT LONG TERM GOAL #4   Title Pt will verbalize beginning return to independent exercise at home for continued overall health and wellness and return to PLOF by 6/14   Baseline Pt is trying to exercises and started walking and doing exercises so far given at PT   Time 6   Period Weeks   Status On-going     PT LONG TERM GOAL #5   Title Pt will verbalize knowledge for independence in self management of innominate rotation for future rotation cases   Baseline Pt needs reinforcement for strengthening and education for self care   Time 6   Period Weeks   Status On-going     Additional Long Term Goals   Additional Long Term Goals Yes     PT LONG TERM GOAL #6   Title Lower extremity hip MMT to be at 4+/5 in hip abd, and ext bil to help maintain pelvic alignment by 6/14   Baseline right hip ext and abd 4/5   Time 6   Period Weeks   Status New     PT LONG TERM GOAL #7   Title Pt will be able to verbalize community wellness opportuniites for continuing strengthening post DC by 6/14   Baseline Presently has no knowledge   Time 6   Period Weeks   Status New               Plan - 01/25/17 0929    Clinical Impression Statement [name] with notable  hypermobility of R SIJ and today presents with right anterior inominate rotation,  Pt utilizes SI belt when up right and standing but does not work as well when sitting. .Pt had MRI and x ray which only showed mild arthritis but nothing remarkable.  MD concurs with SIJ dysfunction and has scheduled RIght SIJ injectiono for May 9th.  MD would like to continue  PT after injection to reinforce  HEP for SIJ stabilization.  Pt has 20 visit limit and has utilized 10.  Pt request 1 x a week for  next 6 weeks post injection to maximize  strengthening program and educate on self mobilization of SIJ as necessary. Pt would benefit from extension for skilled PT  for aforementioned reasons.   Rehab Potential Good   PT Frequency 1x / week   PT Duration 6 weeks   PT Treatment/Interventions ADLs/Self Care Home Management;Cryotherapy;Electrical Stimulation;Iontophoresis 81m/ml Dexamethasone;Functional mobility training;Stair training;Gait training;Ultrasound;Traction;Moist Heat;Therapeutic activities;Therapeutic exercise;Balance training;Neuromuscular re-education;Patient/family education;Passive range of motion;Manual techniques;Dry needling;Taping   PT Next Visit Plan continue education on self mobilization to for even pelvic levels.  right ant inominate,  SIJ stabilzation 1 x a week for 6 weeks   PT Home Exercise Plan stand equal on feet, avoid crossing legs, iso add in supine and seated, QL stretch in door, seated pelvic tilt, clam, child pose, opp arm/leg step and punch; elbow/knees planks; lumbo pelvic stability   Consulted and Agree with Plan of Care Patient      Patient will benefit from skilled therapeutic intervention in order to improve the following deficits and impairments:  Difficulty walking, Increased muscle spasms, Decreased activity tolerance, Pain, Improper body mechanics, Decreased strength, Postural dysfunction  Visit Diagnosis: Chronic right-sided low back pain, with sciatica presence unspecified  Pain in right hip     Problem List Patient Active Problem List   Diagnosis Date Noted  . Pain in right hip 11/29/2016  .  Health care maintenance 11/29/2016  . SVD (spontaneous vaginal delivery) 07/23/2014  . Breech extraction, delivered 07/23/2014  . Dichorionic diamniotic twin pregnancy in third trimester 07/22/2014  . GDM, class A1 07/22/2014  . Palpitations 04/15/2014  . Migraine with aura 02/16/2009  . ALLERGIC RHINITIS 02/16/2009  . PAP SMEAR, ABNORMAL 02/16/2009  . BACK STRAIN, LUMBAR 02/16/2009  .  PERSONAL HISTORY OF ALLERGY TO PEANUTS 02/16/2009   Voncille Lo, PT Certified Exercise Expert for the Aging Adult  01/25/17 9:56 AM Phone: [phone] Fax: [facility] [address] [place], [place], [address] Phone: [phone]   Fax:  [phone]  Name: [name] MRN: [id number] Date of Birth: 1976/06/07

## 2017-01-31 ENCOUNTER — Ambulatory Visit (INDEPENDENT_AMBULATORY_CARE_PROVIDER_SITE_OTHER): Payer: 59

## 2017-01-31 ENCOUNTER — Ambulatory Visit (INDEPENDENT_AMBULATORY_CARE_PROVIDER_SITE_OTHER): Payer: 59 | Admitting: Physical Medicine and Rehabilitation

## 2017-01-31 VITALS — BP 114/77

## 2017-01-31 DIAGNOSIS — M461 Sacroiliitis, not elsewhere classified: Secondary | ICD-10-CM

## 2017-01-31 NOTE — Progress Notes (Deleted)
Right low back buttock area pain. Pain for around 2 years. Good days and bad days. Radiates down leg at times and up back into shoulder. Seems to be aggravated by bending and squatting.

## 2017-01-31 NOTE — Progress Notes (Signed)
[name] - 41 y.o. female MRN [id number]  Date of birth: 16-Apr-1976  Office Visit Note: Visit Date: 01/31/2017 PCP: Odella Aquas, NP Referred by: Odella Aquas, NP  Subjective: Chief Complaint  Patient presents with  . Lower Back - Pain   HPI: [name] is a pleasant 41 year old female with essentially 2 years of right low back and buttock pain. This occurred around the time of her delivery of twins. She has good and bad days. The pain does radiate down the leg at times. She also says she has a lot of pain going up the back into the shoulder. Seems to be aggravated by bending and squatting and going from sit to stand. She's been followed by Dr. Wyline Copas and really has felt conservative care over this number of years. She has an MRI of the lumbar spine which is essentially normal. She has mild facet arthropathy at L4-5 and mild bulging at L5-S1.    ROS Otherwise per HPI.  Assessment & Plan: Visit Diagnoses:  1. Sacroiliitis ([place])     Plan: Findings:  Diagnostic and hopefully therapeutic right sacroiliac joint injection. Flow of contrast was good the lower part of the joint. She had concordant pain during the injection. Exam was consistent with positive tactics test and positive Fortin finger sign.    Meds & Orders: No orders of the defined types were placed in this encounter.   Orders Placed This Encounter  Procedures  . Large Joint Injection/Arthrocentesis  . XR C-ARM NO REPORT    Follow-up: Return if symptoms worsen or fail to improve.   Procedures: Sacroiliac joint injection fluoroscopic guidance. Date/Time: 01/31/2017 3:59 PM Performed by: Magnus Sinning Authorized by: Magnus Sinning   Consent Given by:  Patient Site marked: the procedure site was marked   Timeout: prior to procedure the correct patient, procedure, and site was verified   Indications:  Pain and diagnostic evaluation Location:  Sacroiliac Site:  R sacroiliac joint Prep: patient was prepped  and draped in usual sterile fashion   Needle Size:  22 G Needle Length:  3.5 inches Approach:  Posterior Ultrasound Guidance: No   Fluoroscopic Guidance: Yes   Arthrogram: No   Medications:  2 mL bupivacaine 0.5 %; 80 mg methylPREDNISolone acetate 80 MG/ML Aspiration Attempted: No   Patient tolerance:  Patient tolerated the procedure well with no immediate complications  There was excellent flow of contrast producing a partial arthrogram of the sacroiliac joint.      No notes on file   Clinical History: FINDINGS: Segmentation:  Standard  Alignment:  Physiologic.  Vertebrae:  No fracture, evidence of discitis, or bone lesion.  Conus medullaris: Extends to the L1 level and appears normal.  Paraspinal and other soft tissues: Negative  Disc levels:  L1-L2:  Normal.  L2-L3:  Normal.  L3-L4:  Normal.  L4-L5:  Normal disc space.  Mild facet arthropathy.  No impingement.  L5-S1:  Annular bulge.  No impingement.  IMPRESSION: Minor lumbar spondylosis.  No disc protrusion, spinal stenosis, or significant impingement.   Electronically Signed   By: Staci Righter M.D.   On: 01/20/2017 08:45  She reports that she quit smoking about 18 years ago. She has a 0.50 pack-year smoking history. She has never used smokeless tobacco.   Recent Labs  11/29/16 0905  HGBA1C 4.7*    Objective:  VS:  HT:    WT:   BMI:     BP:114/77  HR: bpm  TEMP: ( )  RESP:  Physical Exam  Musculoskeletal:  Patient ambulates without aid. She has good distal strength. She has a positive Fortin finger sign and equivocally positive Patrick's test with external rotation.    Ortho Exam Imaging: No results found.  Past Medical/Family/Surgical/Social History: Medications & Allergies reviewed per EMR Patient Active Problem List   Diagnosis Date Noted  . Pain in right hip 11/29/2016  . Health care maintenance 11/29/2016  . SVD (spontaneous vaginal delivery) 07/23/2014  . Breech  extraction, delivered 07/23/2014  . Dichorionic diamniotic twin pregnancy in third trimester 07/22/2014  . GDM, class A1 07/22/2014  . Palpitations 04/15/2014  . Migraine with aura 02/16/2009  . ALLERGIC RHINITIS 02/16/2009  . PAP SMEAR, ABNORMAL 02/16/2009  . BACK STRAIN, LUMBAR 02/16/2009  . PERSONAL HISTORY OF ALLERGY TO PEANUTS 02/16/2009   Past Medical History:  Diagnosis Date  . Abnormal Pap smear   . AMA (advanced maternal age) multigravida 20+   . Breech extraction, delivered 07/23/2014  . Chronic headaches   . Dichorionic diamniotic twin pregnancy in third trimester 07/22/2014  . GDM, class A1 07/22/2014  . Gestational diabetes    diet controlled  . Heart murmur   . Palpitations   . Subfertility of couple   . SVD (spontaneous vaginal delivery) 04/19/2012  . SVD (spontaneous vaginal delivery) 07/23/2014  . Thrombocytopenia complicating pregnancy (HCC)    mild  . Vaginal Pap smear, abnormal    Family History  Problem Relation Age of Onset  . Arthritis Mother   . Depression Mother   . Hypertension Mother   . Hypothyroidism Mother   . Migraines Mother   . Miscarriages / [place] Mother   . Hypertension Sister   . Hypothyroidism Sister   . Migraines Sister   . Healthy Father   . Coronary artery disease Maternal Grandfather   . Arthritis Maternal Grandfather   . Heart disease Maternal Grandfather   . Hypertension Maternal Grandfather   . Stroke Maternal Grandfather   . Coronary artery disease Paternal Grandfather   . Arthritis Paternal Grandfather   . Heart disease Paternal Grandfather   . Hypertension Paternal Grandfather   . Stroke Paternal Grandfather   . Cancer Maternal Aunt        breast and cervical  . Depression Maternal Uncle   . Migraines Maternal Grandmother   . Arthritis Maternal Grandmother   . Hypertension Maternal Grandmother   . Stroke Maternal Grandmother   . Arthritis Paternal Grandmother   . Hypertension Paternal Grandmother   .  Stroke Paternal Grandmother   . Cancer Maternal Aunt        leaukemia   Past Surgical History:  Procedure Laterality Date  . COLPOSCOPY  2009  . HYSTEROSCOPY    . resection of polyp    . VAGINAL DELIVERY  07/23/2014   Procedure: VAGINAL DELIVERY;  Surgeon: Janyth Contes, MD;  Location: [facility];  Service: Obstetrics;;  Twins  . WISDOM TOOTH EXTRACTION     Social History   Occupational History  . Nurse, learning disability    Social History Main Topics  . Smoking status: Former Smoker    Packs/day: 0.25    Years: 2.00    Quit date: 09/25/1998  . Smokeless tobacco: Never Used  . Alcohol use No  . Drug use: No  . Sexual activity: Yes    Birth control/ protection: None, IUD

## 2017-01-31 NOTE — Patient Instructions (Signed)

## 2017-02-01 ENCOUNTER — Other Ambulatory Visit (INDEPENDENT_AMBULATORY_CARE_PROVIDER_SITE_OTHER): Payer: 59

## 2017-02-01 ENCOUNTER — Ambulatory Visit: Payer: 59 | Admitting: Physical Therapy

## 2017-02-01 ENCOUNTER — Encounter: Payer: Self-pay | Admitting: Physical Therapy

## 2017-02-01 DIAGNOSIS — E559 Vitamin D deficiency, unspecified: Secondary | ICD-10-CM

## 2017-02-01 DIAGNOSIS — M545 Low back pain: Principal | ICD-10-CM

## 2017-02-01 DIAGNOSIS — G8929 Other chronic pain: Secondary | ICD-10-CM

## 2017-02-01 DIAGNOSIS — M25551 Pain in right hip: Secondary | ICD-10-CM

## 2017-02-01 NOTE — Therapy (Signed)
[place], [place], [address] Phone: [phone]   Fax:  [phone]  Physical Therapy Treatment  Patient Details  Name: [name] MRN: [id number] Date of Birth: September 28, 1975 Referring Provider: Odella Aquas, NP  Encounter Date: 02/01/2017      PT End of Session - 02/01/17 0804    Visit Number 11   Number of Visits 13   Date for PT Re-Evaluation 03/08/17   Authorization Type U[employer]- 20 visit limit   PT Start Time 0803   PT Stop Time 0846   PT Time Calculation (min) 43 min   Activity Tolerance Patient tolerated treatment well   Behavior During Therapy [facility] for tasks assessed/performed      Past Medical History:  Diagnosis Date  . Abnormal Pap smear   . AMA (advanced maternal age) multigravida 61+   . Breech extraction, delivered 07/23/2014  . Chronic headaches   . Dichorionic diamniotic twin pregnancy in third trimester 07/22/2014  . GDM, class A1 07/22/2014  . Gestational diabetes    diet controlled  . Heart murmur   . Palpitations   . Subfertility of couple   . SVD (spontaneous vaginal delivery) 04/19/2012  . SVD (spontaneous vaginal delivery) 07/23/2014  . Thrombocytopenia complicating pregnancy (HCC)    mild  . Vaginal Pap smear, abnormal     Past Surgical History:  Procedure Laterality Date  . COLPOSCOPY  2009  . HYSTEROSCOPY    . resection of polyp    . VAGINAL DELIVERY  07/23/2014   Procedure: VAGINAL DELIVERY;  Surgeon: Janyth Contes, MD;  Location: [facility];  Service: Obstetrics;;  Twins  . WISDOM TOOTH EXTRACTION      There were no vitals filed for this visit.      Subjective Assessment - 02/01/17 0805    Subjective Pt reports MD placed injection and everything spasmed. Is sore this morning. Cannot feel concordant pain at small spot, wide spread soreness.    Patient Stated Goals pick up children, getting up from floor, manage pain independently, return to exercise  program   Currently in Pain? Yes   Pain Score 3    Pain Location Back  SIJ region   Pain Orientation Lower   Pain Descriptors / Indicators Sore                         [facility] Adult PT Treatment/Exercise - 02/01/17 0001      Lumbar Exercises: Standing   Other Standing Lumbar Exercises iso lat press into physioball     Lumbar Exercises: Supine   Bridge Limitations straight leg bridge, legs up wedge   Other Supine Lumbar Exercises hooklying clam blue tband with pelvic tilt     Lumbar Exercises: Prone   Other Prone Lumbar Exercises prone abdominal engagement with ball squeeze bw feet-knees 90     Lumbar Exercises: Quadruped   Plank qped hip hike-small     Knee/Hip Exercises: Standing   Heel Raises Limitations at counter- blue tband at knees   Other Standing Knee Exercises heels tog, small squat, LE ER blue tband at knees                PT Education - 02/01/17 0941    Education provided Yes   Education Details rationale for stabilization, postural adjustments at home, use of SI belt   Person(s) Educated Patient   Methods Explanation;Demonstration;Tactile cues;Verbal cues;Handout   Comprehension Verbalized understanding;Returned demonstration;Verbal cues required;Tactile cues  required;Need further instruction          PT Short Term Goals - 01/25/17 0928      PT SHORT TERM GOAL #1   Title Pt will verbalize resting pain <=5/10 to indicate decreasing pain levels by 4/6   Baseline Pt pain is never better than 4/5 out of 10   Time 3   Period Weeks   Status Partially Met     PT SHORT TERM GOAL #2   Title Pt will demo proper lifting posture from floor to waist height to normalize pressure through lumbopelvic region   Baseline pt aware of good body mechanics and tries to be consistent about use   Time 3   Period Weeks   Status Achieved           PT Long Term Goals - 01/25/17 9892      PT LONG TERM GOAL #1   Title Pt will be able to lift  children SIJ pain <=2/10 by 6/14   Baseline 5/10 pain today at re evaluation,    Time 6   Period Weeks   Status On-going     PT LONG TERM GOAL #2   Title Pt will be able to get up and down from the floor without increase in pain to play with children  by 6/14   Baseline pain level 5/10 on pt report, and stiffened spine  on pt report   Time 6   Period Weeks   Status On-going     PT LONG TERM GOAL #3   Title FOTO to 61% ability to indicate significant improvement in functional ability by 6/14   Baseline 45% ability at eval and at todays reevaluation  was 56% on 4-12.  Pt has declinced    Time 6   Period Weeks   Status Not Met     PT LONG TERM GOAL #4   Title Pt will verbalize beginning return to independent exercise at home for continued overall health and wellness and return to PLOF by 6/14   Baseline Pt is trying to exercises and started walking and doing exercises so far given at PT   Time 6   Period Weeks   Status On-going     PT LONG TERM GOAL #5   Title Pt will verbalize knowledge for independence in self management of innominate rotation for future rotation cases   Baseline Pt needs reinforcement for strengthening and education for self care   Time 6   Period Weeks   Status On-going     Additional Long Term Goals   Additional Long Term Goals Yes     PT LONG TERM GOAL #6   Title Lower extremity hip MMT to be at 4+/5 in hip abd, and ext bil to help maintain pelvic alignment by 6/14   Baseline right hip ext and abd 4/5   Time 6   Period Weeks   Status New     PT LONG TERM GOAL #7   Title Pt will be able to verbalize community wellness opportuniites for continuing strengthening post DC by 6/14   Baseline Presently has no knowledge   Time 6   Period Weeks   Status New               Plan - 02/01/17 1194    Clinical Impression Statement Pt with general soreness in lumbopelvic region today but denied any concordant pinching pain with activities today. Focused  on stability in neutral positions, avoiding excessive lumbopelvic  flexion, extension or rotation. I advised her not to walk today to allow injection medication to settle a little more and return to walking tomorrow to tolerance on flat surfaces.    PT Next Visit Plan lumbo pelvic stabilization   PT Home Exercise Plan stand equal on feet, avoid crossing legs, iso add in supine and seated, QL stretch in door, seated pelvic tilt, clam, child pose, opp arm/leg step and punch; elbow/knees planks; lumbo pelvic stability; iso lat press standing, long leg mini bridge, standing mini squat in ER with band pull outs   Consulted and Agree with Plan of Care Patient      Patient will benefit from skilled therapeutic intervention in order to improve the following deficits and impairments:     Visit Diagnosis: Chronic right-sided low back pain, with sciatica presence unspecified  Pain in right hip     Problem List Patient Active Problem List   Diagnosis Date Noted  . Pain in right hip 11/29/2016  . Health care maintenance 11/29/2016  . SVD (spontaneous vaginal delivery) 07/23/2014  . Breech extraction, delivered 07/23/2014  . Dichorionic diamniotic twin pregnancy in third trimester 07/22/2014  . GDM, class A1 07/22/2014  . Palpitations 04/15/2014  . Migraine with aura 02/16/2009  . ALLERGIC RHINITIS 02/16/2009  . PAP SMEAR, ABNORMAL 02/16/2009  . BACK STRAIN, LUMBAR 02/16/2009  . PERSONAL HISTORY OF ALLERGY TO PEANUTS 02/16/2009    Yetunde Leis C. Teryl Gubler PT, DPT 02/01/17 9:43 AM   [facility] [address] [place], [place], [address] Phone: [phone]   Fax:  [phone]  Name: [name] MRN: [id number] Date of Birth: May 20, 1976

## 2017-02-02 LAB — VITAMIN D 25 HYDROXY (VIT D DEFICIENCY, FRACTURES): Vit D, 25-Hydroxy: 33.4 ng/mL (ref 30.0–100.0)

## 2017-02-02 MED ORDER — METHYLPREDNISOLONE ACETATE 80 MG/ML IJ SUSP
80.0000 mg | INTRAMUSCULAR | Status: AC | PRN
  Administered 2017-01-31: 80 mg via INTRA_ARTICULAR

## 2017-02-02 MED ORDER — BUPIVACAINE HCL 0.5 % IJ SOLN
2.0000 mL | INTRAMUSCULAR | Status: AC | PRN
  Administered 2017-01-31: 2 mL via INTRA_ARTICULAR

## 2017-02-08 ENCOUNTER — Ambulatory Visit: Payer: 59 | Admitting: Physical Therapy

## 2017-02-08 ENCOUNTER — Encounter: Payer: Self-pay | Admitting: Physical Therapy

## 2017-02-08 DIAGNOSIS — M545 Low back pain: Principal | ICD-10-CM

## 2017-02-08 DIAGNOSIS — M25551 Pain in right hip: Secondary | ICD-10-CM

## 2017-02-08 DIAGNOSIS — G8929 Other chronic pain: Secondary | ICD-10-CM

## 2017-02-08 NOTE — Therapy (Signed)
[place], [place], [address] Phone: [phone]   Fax:  [phone]  Physical Therapy Treatment  Patient Details  Name: [name] MRN: [id number] Date of Birth: 1976-08-11 Referring Provider: Odella Aquas, NP  Encounter Date: 02/08/2017      PT End of Session - 02/08/17 0901    Visit Number 12   Number of Visits 13   Date for PT Re-Evaluation 03/08/17   Authorization Type UHC- 20 visit limit   PT Start Time [phone]  pt arrived late   PT Stop Time 0930   PT Time Calculation (min) 39 min   Activity Tolerance Patient tolerated treatment well   Behavior During Therapy [facility] for tasks assessed/performed      Past Medical History:  Diagnosis Date  . Abnormal Pap smear   . AMA (advanced maternal age) multigravida 74+   . Breech extraction, delivered 07/23/2014  . Chronic headaches   . Dichorionic diamniotic twin pregnancy in third trimester 07/22/2014  . GDM, class A1 07/22/2014  . Gestational diabetes    diet controlled  . Heart murmur   . Palpitations   . Subfertility of couple   . SVD (spontaneous vaginal delivery) 04/19/2012  . SVD (spontaneous vaginal delivery) 07/23/2014  . Thrombocytopenia complicating pregnancy (HCC)    mild  . Vaginal Pap smear, abnormal     Past Surgical History:  Procedure Laterality Date  . COLPOSCOPY  2009  . HYSTEROSCOPY    . resection of polyp    . VAGINAL DELIVERY  07/23/2014   Procedure: VAGINAL DELIVERY;  Surgeon: Janyth Contes, MD;  Location: [facility];  Service: Obstetrics;;  Twins  . WISDOM TOOTH EXTRACTION      There were no vitals filed for this visit.      Subjective Assessment - 02/08/17 0858    Subjective Right now, feels great, maybe just a little ache. Has fluctuated between pain and no pain. Quick episodes of pain that she is able to reduce with heat and rest. Saw massage therapist who worked on the area around her SIJ and had multiple  muscle twitches. Was able to get up from floor with kids without feeling arthritic   Patient Stated Goals pick up children, getting up from floor, manage pain independently, return to exercise program   Currently in Pain? No/denies   Aggravating Factors  not being aware of posture   Pain Relieving Factors rest, heat                         [facility] Adult PT Treatment/Exercise - 02/08/17 0001      Lumbar Exercises: Supine   Bent Knee Raise Limitations bilateral bent knee raise from bolster   Bridge Limitations straight leg bridges, legs on bolster, cues to lift R hip even with L     Knee/Hip Exercises: Sidelying   Clams heavy tactile & verbal cues to reduce use of QL     Knee/Hip Exercises: Prone   Hip Extension Limitations quad set to hip ext, flexed over pillows                PT Education - 02/08/17 0931    Education provided Yes   Education Details exercise form/rationale, discussion of POC and sensations with rotations & stability   Person(s) Educated Patient   Methods Explanation;Demonstration;Tactile cues;Verbal cues;Handout   Comprehension Verbalized understanding;Returned demonstration;Verbal cues required;Tactile cues required;Need further instruction  PT Short Term Goals - 01/25/17 0928      PT SHORT TERM GOAL #1   Title Pt will verbalize resting pain <=5/10 to indicate decreasing pain levels by 4/6   Baseline Pt pain is never better than 4/5 out of 10   Time 3   Period Weeks   Status Partially Met     PT SHORT TERM GOAL #2   Title Pt will demo proper lifting posture from floor to waist height to normalize pressure through lumbopelvic region   Baseline pt aware of good body mechanics and tries to be consistent about use   Time 3   Period Weeks   Status Achieved           PT Long Term Goals - 01/25/17 6270      PT LONG TERM GOAL #1   Title Pt will be able to lift children SIJ pain <=2/10 by 6/14   Baseline 5/10 pain today  at re evaluation,    Time 6   Period Weeks   Status On-going     PT LONG TERM GOAL #2   Title Pt will be able to get up and down from the floor without increase in pain to play with children  by 6/14   Baseline pain level 5/10 on pt report, and stiffened spine  on pt report   Time 6   Period Weeks   Status On-going     PT LONG TERM GOAL #3   Title FOTO to 61% ability to indicate significant improvement in functional ability by 6/14   Baseline 45% ability at eval and at todays reevaluation  was 56% on 4-12.  Pt has declinced    Time 6   Period Weeks   Status Not Met     PT LONG TERM GOAL #4   Title Pt will verbalize beginning return to independent exercise at home for continued overall health and wellness and return to PLOF by 6/14   Baseline Pt is trying to exercises and started walking and doing exercises so far given at PT   Time 6   Period Weeks   Status On-going     PT LONG TERM GOAL #5   Title Pt will verbalize knowledge for independence in self management of innominate rotation for future rotation cases   Baseline Pt needs reinforcement for strengthening and education for self care   Time 6   Period Weeks   Status On-going     Additional Long Term Goals   Additional Long Term Goals Yes     PT LONG TERM GOAL #6   Title Lower extremity hip MMT to be at 4+/5 in hip abd, and ext bil to help maintain pelvic alignment by 6/14   Baseline right hip ext and abd 4/5   Time 6   Period Weeks   Status New     PT LONG TERM GOAL #7   Title Pt will be able to verbalize community wellness opportuniites for continuing strengthening post DC by 6/14   Baseline Presently has no knowledge   Time 6   Period Weeks   Status New               Plan - 02/08/17 1011    Clinical Impression Statement Focused on small movements and proper activation without use of QL that is continuing to spasm occasionally. pt remained in a neutral pelvic rotation since last visit. Will determine  further POC depending on how well pt feels over  the next week.    PT Next Visit Plan lumbo pelvic stabilization   Consulted and Agree with Plan of Care Patient      Patient will benefit from skilled therapeutic intervention in order to improve the following deficits and impairments:     Visit Diagnosis: Chronic right-sided low back pain, with sciatica presence unspecified  Pain in right hip     Problem List Patient Active Problem List   Diagnosis Date Noted  . Pain in right hip 11/29/2016  . Health care maintenance 11/29/2016  . SVD (spontaneous vaginal delivery) 07/23/2014  . Breech extraction, delivered 07/23/2014  . Dichorionic diamniotic twin pregnancy in third trimester 07/22/2014  . GDM, class A1 07/22/2014  . Palpitations 04/15/2014  . Migraine with aura 02/16/2009  . ALLERGIC RHINITIS 02/16/2009  . PAP SMEAR, ABNORMAL 02/16/2009  . BACK STRAIN, LUMBAR 02/16/2009  . PERSONAL HISTORY OF ALLERGY TO PEANUTS 02/16/2009   Charleston Hankin C. Clebert Wenger PT, DPT 02/08/17 10:13 AM   [facility] [address] [place], [place], [address] Phone: [phone]   Fax:  [phone]  Name: [name] MRN: [id number] Date of Birth: Oct 17, 1975

## 2017-02-13 ENCOUNTER — Ambulatory Visit: Payer: 59 | Admitting: Physical Therapy

## 2017-02-13 ENCOUNTER — Telehealth: Payer: Self-pay | Admitting: Adult Health

## 2017-02-13 NOTE — Telephone Encounter (Signed)
Pt called states she went to [facility] this weekend for a sinus infection and was gvn Amoxicillin by them, but feels it is not working and wants to see [name]/ or advice to see on what to do since medication is not reducing symptoms.-- Please call her at [phone]. --glh

## 2017-02-13 NOTE — Telephone Encounter (Signed)
Pt states that she is still having a lot of sinus pressure and nasal drainage.  Denies fevers and cough.  Advised pt to use saline sinus rinses, Mucinex per box directions or Sudafed, increase water intake, and use Vitamin C.  Pt advised to call back if she is not feeling better in a couple of days or if she develops fevers, chills, cough, SOB or nausea and vomiting.  Pt expressed understanding and is agreeable.  Charyl Bigger, CMA

## 2017-02-22 ENCOUNTER — Ambulatory Visit: Payer: 59 | Admitting: Physical Therapy

## 2017-02-22 ENCOUNTER — Encounter: Payer: Self-pay | Admitting: Physical Therapy

## 2017-02-22 DIAGNOSIS — G8929 Other chronic pain: Secondary | ICD-10-CM

## 2017-02-22 DIAGNOSIS — M25551 Pain in right hip: Secondary | ICD-10-CM

## 2017-02-22 DIAGNOSIS — M545 Low back pain: Principal | ICD-10-CM

## 2017-02-22 NOTE — Therapy (Signed)
[place], [place], [address] Phone: [phone]   Fax:  [phone]  Physical Therapy Treatment  Patient Details  Name: [name] MRN: [id number] Date of Birth: 1976-06-06 Referring Provider: Odella Aquas, NP  Encounter Date: 02/22/2017      PT End of Session - 02/22/17 0850    Visit Number 13   Number of Visits 14   Date for PT Re-Evaluation 03/08/17   Authorization Type U[employer]- 20 visit limit   PT Start Time 0850   PT Stop Time 0930   PT Time Calculation (min) 40 min   Activity Tolerance Patient tolerated treatment well   Behavior During Therapy [facility] for tasks assessed/performed      Past Medical History:  Diagnosis Date  . Abnormal Pap smear   . AMA (advanced maternal age) multigravida 50+   . Breech extraction, delivered 07/23/2014  . Chronic headaches   . Dichorionic diamniotic twin pregnancy in third trimester 07/22/2014  . GDM, class A1 07/22/2014  . Gestational diabetes    diet controlled  . Heart murmur   . Palpitations   . Subfertility of couple   . SVD (spontaneous vaginal delivery) 04/19/2012  . SVD (spontaneous vaginal delivery) 07/23/2014  . Thrombocytopenia complicating pregnancy (HCC)    mild  . Vaginal Pap smear, abnormal     Past Surgical History:  Procedure Laterality Date  . COLPOSCOPY  2009  . HYSTEROSCOPY    . resection of polyp    . VAGINAL DELIVERY  07/23/2014   Procedure: VAGINAL DELIVERY;  Surgeon: Janyth Contes, MD;  Location: [facility];  Service: Obstetrics;;  Twins  . WISDOM TOOTH EXTRACTION      There were no vitals filed for this visit.      Subjective Assessment - 02/22/17 0851    Subjective Was very busy over the last week which resulted in soreness that has decreased. Is meeting with personal trainer tomorrow.    Patient Stated Goals pick up children, getting up from floor, manage pain independently, return to exercise program   Currently  in Pain? Yes   Pain Location Back   Pain Orientation Lower   Pain Descriptors / Indicators Sore   Aggravating Factors  doing too much   Pain Relieving Factors rest, being aware            [facility] PT Assessment - 02/22/17 0001      Assessment   Medical Diagnosis R hip pain   Referring Provider Odella Aquas, NP     Observation/Other Assessments   Focus on Therapeutic Outcomes (FOTO)  69% ability                     [facility] PT Treatment/Exercise - 02/22/17 0001      Exercises   Exercises Other Exercises   Other Exercises  kettle bell-standing, supine, high kneeling                PT Education - 02/22/17 1021    Education provided Yes   Education Details progress toward goals, pain with functional activities, being aware of sensations, beginning with trainer, gradual increase in workout progression   Person(s) Educated Patient   Methods Explanation;Demonstration;Tactile cues;Verbal cues   Comprehension Verbalized understanding;Returned demonstration;Verbal cues required;Tactile cues required;Need further instruction          PT Short Term Goals - 01/25/17 0928      PT SHORT TERM GOAL #1   Title Pt will  verbalize resting pain <=5/10 to indicate decreasing pain levels by 4/6   Baseline Pt pain is never better than 4/5 out of 10   Time 3   Period Weeks   Status Partially Met     PT SHORT TERM GOAL #2   Title Pt will demo proper lifting posture from floor to waist height to normalize pressure through lumbopelvic region   Baseline pt aware of good body mechanics and tries to be consistent about use   Time 3   Period Weeks   Status Achieved           PT Long Term Goals - 02/22/17 7026      PT LONG TERM GOAL #1   Title Pt will be able to lift children SIJ pain <=2/10 by 6/14   Baseline 2/10 on average   Status Achieved     PT LONG TERM GOAL #2   Title Pt will be able to get up and down from the floor without increase in pain to play  with children  by 6/14   Baseline 2-3/10 on average   Status On-going     PT LONG TERM GOAL #3   Title FOTO to 61% ability to indicate significant improvement in functional ability by 6/14   Baseline 69% on 5/31   Status Achieved     PT LONG TERM GOAL #4   Title Pt will verbalize beginning return to independent exercise at home for continued overall health and wellness and return to PLOF by 6/14   Baseline doing exercises at home with some challenge   Status Achieved     PT LONG TERM GOAL #5   Title Pt will verbalize knowledge for independence in self management of innominate rotation for future rotation cases   Baseline has to think hard about what she is feeling   Status Achieved     PT LONG TERM GOAL #6   Title Lower extremity hip MMT to be at 4+/5 in hip abd, and ext bil to help maintain pelvic alignment by 6/14   Baseline hip abd 5/5 bilat, hip ext 5/5 bilat   Status Achieved     PT LONG TERM GOAL #7   Title Pt will be able to verbalize community wellness opportuniites for continuing strengthening post DC by 6/14   Baseline visiting with personal trainer tomorrow   Status Achieved               Plan - 02/22/17 1016    Clinical Impression Statement Pt has made good progress toward goals and is going to begin with personal trainer tomorrow. Pt is still unstable and requires cuing for appropriate use of core and gluts in new movements. Pt wants to use kettle bells with husband so we practiced exericses using kettle bells that were similar to functional movements such as lifting children and giving baths. pt was able to feel appropraite musculature activate with cuing. Will follow up in two weeks to be sure that pt is able to maintain proper alignment as she transitions into exercise program.    PT Treatment/Interventions ADLs/Self Care Home Management;Cryotherapy;Electrical Stimulation;Iontophoresis 29m/ml Dexamethasone;Functional mobility training;Stair training;Gait  training;Ultrasound;Traction;Moist Heat;Therapeutic activities;Therapeutic exercise;Balance training;Neuromuscular re-education;Patient/family education;Passive range of motion;Manual techniques;Dry needling;Taping   PT Next Visit Plan lumbo pelvic stabilization   PT Home Exercise Plan stand equal on feet, avoid crossing legs, iso add in supine and seated, QL stretch in door, seated pelvic tilt, clam, child pose, opp arm/leg step and punch; elbow/knees planks; lumbo pelvic  stability; iso lat press standing, long leg mini bridge, standing mini squat in ER with band pull outs   Consulted and Agree with Plan of Care Patient      Patient will benefit from skilled therapeutic intervention in order to improve the following deficits and impairments:  Difficulty walking, Increased muscle spasms, Decreased activity tolerance, Pain, Improper body mechanics, Decreased strength, Postural dysfunction  Visit Diagnosis: Chronic right-sided low back pain, with sciatica presence unspecified - Plan: PT plan of care cert/re-cert  Pain in right hip - Plan: PT plan of care cert/re-cert     Problem List Patient Active Problem List   Diagnosis Date Noted  . Pain in right hip 11/29/2016  . Health care maintenance 11/29/2016  . SVD (spontaneous vaginal delivery) 07/23/2014  . Breech extraction, delivered 07/23/2014  . Dichorionic diamniotic twin pregnancy in third trimester 07/22/2014  . GDM, class A1 07/22/2014  . Palpitations 04/15/2014  . Migraine with aura 02/16/2009  . ALLERGIC RHINITIS 02/16/2009  . PAP SMEAR, ABNORMAL 02/16/2009  . BACK STRAIN, LUMBAR 02/16/2009  . PERSONAL HISTORY OF ALLERGY TO PEANUTS 02/16/2009   Eyan Hagood C. Ciel Yanes PT, DPT 02/22/17 10:24 AM   [facility] [address] [place], [place], [address] Phone: [phone]   Fax:  [phone]  Name: Chayil Gantt MRN: [id number] Date of Birth: [birth year]

## 2017-02-27 ENCOUNTER — Ambulatory Visit: Payer: 59 | Admitting: Physical Therapy

## 2017-03-08 ENCOUNTER — Ambulatory Visit: Payer: 59 | Admitting: Physical Therapy

## 2017-03-20 ENCOUNTER — Ambulatory Visit: Payer: 59 | Admitting: Physical Therapy

## 2017-04-03 ENCOUNTER — Other Ambulatory Visit: Payer: Self-pay | Admitting: Adult Health

## 2017-04-04 ENCOUNTER — Encounter: Payer: Self-pay | Admitting: Physical Therapy

## 2017-04-04 ENCOUNTER — Ambulatory Visit: Payer: 59 | Attending: Adult Health | Admitting: Physical Therapy

## 2017-04-04 DIAGNOSIS — M25551 Pain in right hip: Secondary | ICD-10-CM | POA: Insufficient documentation

## 2017-04-04 DIAGNOSIS — M545 Low back pain: Secondary | ICD-10-CM | POA: Diagnosis present

## 2017-04-04 DIAGNOSIS — G8929 Other chronic pain: Secondary | ICD-10-CM | POA: Diagnosis present

## 2017-04-04 NOTE — Therapy (Signed)
[place], [place], [address] Phone: [phone]   Fax:  [phone]  Physical Therapy Treatment/Discharge Summary  Patient Details  Name: [name] MRN: [id number] Date of Birth: 1975-10-28 Referring Provider: Lillard Anes, NP  Encounter Date: 04/04/2017      PT End of Session - 04/04/17 0855    Visit Number 14   Number of Visits 14   Date for PT Re-Evaluation 03/08/17   Authorization Type UHC- 20 visit limit   PT Start Time 0854   PT Stop Time 0913   PT Time Calculation (min) 19 min   Activity Tolerance Patient tolerated treatment well   Behavior During Therapy [facility] for tasks assessed/performed      Past Medical History:  Diagnosis Date  . Abnormal Pap smear   . AMA (advanced maternal age) multigravida 46+   . Breech extraction, delivered 07/23/2014  . Chronic headaches   . Dichorionic diamniotic twin pregnancy in third trimester 07/22/2014  . GDM, class A1 07/22/2014  . Gestational diabetes    diet controlled  . Heart murmur   . Palpitations   . Subfertility of couple   . SVD (spontaneous vaginal delivery) 04/19/2012  . SVD (spontaneous vaginal delivery) 07/23/2014  . Thrombocytopenia complicating pregnancy (HCC)    mild  . Vaginal Pap smear, abnormal     Past Surgical History:  Procedure Laterality Date  . COLPOSCOPY  2009  . HYSTEROSCOPY    . resection of polyp    . VAGINAL DELIVERY  07/23/2014   Procedure: VAGINAL DELIVERY;  Surgeon: Janyth Contes, MD;  Location: [facility];  Service: Obstetrics;;  Twins  . WISDOM TOOTH EXTRACTION      There were no vitals filed for this visit.      Subjective Assessment - 04/04/17 0914    Subjective Feeling good, has increased walking distance. Able to get up and down from the floor. A little sore today in R calf.    Currently in Pain? No/denies            [facility] PT Assessment - 04/04/17 0001      Assessment   Medical  Diagnosis R hip pain   Referring Provider Lillard Anes, NP     Observation/Other Assessments   Focus on Therapeutic Outcomes (FOTO)  71% ability                             PT Education - 04/04/17 0913    Education provided Yes   Education Details importance of stretching, drinking water, being aware of posture. What to do if she feels pain again, do exercises even when she is not hurting.    Person(s) Educated Patient   Methods Explanation   Comprehension Verbalized understanding          PT Short Term Goals - 01/25/17 0928      PT SHORT TERM GOAL #1   Title Pt will verbalize resting pain <=5/10 to indicate decreasing pain levels by 4/6   Baseline Pt pain is never better than 4/5 out of 10   Time 3   Period Weeks   Status Partially Met     PT SHORT TERM GOAL #2   Title Pt will demo proper lifting posture from floor to waist height to normalize pressure through lumbopelvic region   Baseline pt aware of good body mechanics and tries to be consistent about use   Time 3  Period Weeks   Status Achieved           PT Long Term Goals - 04/04/17 0915      PT LONG TERM GOAL #1   Title Pt will be able to lift children SIJ pain <=2/10 by 6/14   Baseline 0-2/10 on avg   Status Achieved     PT LONG TERM GOAL #2   Title Pt will be able to get up and down from the floor without increase in pain to play with children  by 6/14   Baseline able   Status Achieved     PT LONG TERM GOAL #3   Title FOTO to 61% ability to indicate significant improvement in functional ability by 6/14   Baseline 71% ability   Status Achieved     PT LONG TERM GOAL #4   Title Pt will verbalize beginning return to independent exercise at home for continued overall health and wellness and return to PLOF by 6/14   Baseline has been increasing walking program   Status Achieved     PT LONG TERM GOAL #5   Title Pt will verbalize knowledge for independence in self management of  innominate rotation for future rotation cases   Baseline has to think hard about what she is feeling   Status Achieved     PT LONG TERM GOAL #6   Title Lower extremity hip MMT to be at 4+/5 in hip abd, and ext bil to help maintain pelvic alignment by 6/14   Baseline hip abd 5/5 bilat, hip ext 5/5 bilat   Status Achieved     PT LONG TERM GOAL #7   Title Pt will be able to verbalize community wellness opportuniites for continuing strengthening post DC by 6/14   Baseline using trainer and massage therapist   Status Achieved             Patient will benefit from skilled therapeutic intervention in order to improve the following deficits and impairments:     Visit Diagnosis: Chronic right-sided low back pain, with sciatica presence unspecified  Pain in right hip     Problem List Patient Active Problem List   Diagnosis Date Noted  . Pain in right hip 11/29/2016  . Health care maintenance 11/29/2016  . SVD (spontaneous vaginal delivery) 07/23/2014  . Breech extraction, delivered 07/23/2014  . Dichorionic diamniotic twin pregnancy in third trimester 07/22/2014  . GDM, class A1 07/22/2014  . Palpitations 04/15/2014  . Migraine with aura 02/16/2009  . ALLERGIC RHINITIS 02/16/2009  . PAP SMEAR, ABNORMAL 02/16/2009  . BACK STRAIN, LUMBAR 02/16/2009  . PERSONAL HISTORY OF ALLERGY TO PEANUTS 02/16/2009  PHYSICAL THERAPY DISCHARGE SUMMARY  Visits from Start of Care: 14  Current functional level related to goals / functional outcomes: See above   Remaining deficits: See above   Education / Equipment: Anatomy of condition, POC, HEP, exercise form/rationale  Plan: Patient agrees to discharge.  Patient goals were met. Patient is being discharged due to meeting the stated rehab goals.  ?????      Onya Eutsler C. Garl Speigner PT, DPT 04/04/17 9:17 AM   [facility] [address] [place], [place], [address] Phone: [phone]    Fax:  [phone]  Name: [name] MRN: [id number] Date of Birth: Jan 19, 1976

## 2017-04-05 ENCOUNTER — Ambulatory Visit (INDEPENDENT_AMBULATORY_CARE_PROVIDER_SITE_OTHER): Payer: 59 | Admitting: Adult Health

## 2017-04-05 ENCOUNTER — Encounter: Payer: Self-pay | Admitting: Adult Health

## 2017-04-05 DIAGNOSIS — J0100 Acute maxillary sinusitis, unspecified: Secondary | ICD-10-CM | POA: Insufficient documentation

## 2017-04-05 MED ORDER — FLUCONAZOLE 150 MG PO TABS: 150.0000 mg | ORAL_TABLET | Freq: Once | ORAL | 0 refills | Status: AC

## 2017-04-05 MED ORDER — HYDROCOD POLST-CPM POLST ER 10-8 MG PO CP12: 1.0000 | ORAL_CAPSULE | Freq: Two times a day (BID) | ORAL | 0 refills | Status: DC | PRN

## 2017-04-05 MED ORDER — LORATADINE 10 MG PO TABS: 10.0000 mg | ORAL_TABLET | Freq: Every day | ORAL | 2 refills | Status: DC

## 2017-04-05 MED ORDER — AMOXICILLIN-POT CLAVULANATE 875-125 MG PO TABS: 1.0000 | ORAL_TABLET | Freq: Two times a day (BID) | ORAL | 0 refills | Status: DC

## 2017-04-05 MED ORDER — HYDROCOD POLST-CPM POLST ER 10-8 MG/5ML PO SUER: 5.0000 mL | Freq: Two times a day (BID) | ORAL | 0 refills | Status: DC | PRN

## 2017-04-05 NOTE — Progress Notes (Signed)
Subjective:    Patient ID: [name], female    DOB: 1976/06/03, 41 y.o.   MRN: [id number]  HPI: [name] presents with constant, non-productive cough, copious thick yellow nasal drainage, L sided facial pain (7/10), L sided toothache, and HA (2/10)-that all began > 1 week ago and sx's are worsening daily.  She has been taking Claritan, Flonase, and nasal saline rinse with only minor sx relief. She denies fever/night sweats/N/V/D. She also reports pronounced fatigue due to nocturnal cough, she states "I cannot get any sleep".   Patient Care Team    Relationship Specialty Notifications Start End  Esaw Grandchild, NP PCP - General Family Medicine  11/29/16     Patient Active Problem List   Diagnosis Date Noted  . Sinusitis, acute maxillary 04/05/2017  . Pain in right hip 11/29/2016  . Health care maintenance 11/29/2016  . SVD (spontaneous vaginal delivery) 07/23/2014  . Breech extraction, delivered 07/23/2014  . Dichorionic diamniotic twin pregnancy in third trimester 07/22/2014  . GDM, class A1 07/22/2014  . Palpitations 04/15/2014  . Migraine with aura 02/16/2009  . ALLERGIC RHINITIS 02/16/2009  . PAP SMEAR, ABNORMAL 02/16/2009  . BACK STRAIN, LUMBAR 02/16/2009  . PERSONAL HISTORY OF ALLERGY TO PEANUTS 02/16/2009     Past Medical History:  Diagnosis Date  . Abnormal Pap smear   . AMA (advanced maternal age) multigravida 64+   . Breech extraction, delivered 07/23/2014  . Chronic headaches   . Dichorionic diamniotic twin pregnancy in third trimester 07/22/2014  . GDM, class A1 07/22/2014  . Gestational diabetes    diet controlled  . Heart murmur   . Palpitations   . Subfertility of couple   . SVD (spontaneous vaginal delivery) 04/19/2012  . SVD (spontaneous vaginal delivery) 07/23/2014  . Thrombocytopenia complicating pregnancy (HCC)    mild  . Vaginal Pap smear, abnormal      Past Surgical History:  Procedure Laterality Date  . COLPOSCOPY  2009  .  HYSTEROSCOPY    . resection of polyp    . VAGINAL DELIVERY  07/23/2014   Procedure: VAGINAL DELIVERY;  Surgeon: Janyth Contes, MD;  Location: [facility];  Service: Obstetrics;;  Twins  . WISDOM TOOTH EXTRACTION       Family History  Problem Relation Age of Onset  . Arthritis Mother   . Depression Mother   . Hypertension Mother   . Hypothyroidism Mother   . Migraines Mother   . Miscarriages / [place] Mother   . Hypertension Sister   . Hypothyroidism Sister   . Migraines Sister   . Healthy Father   . Coronary artery disease Maternal Grandfather   . Arthritis Maternal Grandfather   . Heart disease Maternal Grandfather   . Hypertension Maternal Grandfather   . Stroke Maternal Grandfather   . Coronary artery disease Paternal Grandfather   . Arthritis Paternal Grandfather   . Heart disease Paternal Grandfather   . Hypertension Paternal Grandfather   . Stroke Paternal Grandfather   . Cancer Maternal Aunt        breast and cervical  . Depression Maternal Uncle   . Migraines Maternal Grandmother   . Arthritis Maternal Grandmother   . Hypertension Maternal Grandmother   . Stroke Maternal Grandmother   . Arthritis Paternal Grandmother   . Hypertension Paternal Grandmother   . Stroke Paternal Grandmother   . Cancer Maternal Aunt        leaukemia     History  Drug Use No  History  Alcohol Use No     History  Smoking Status  . Former Smoker  . Packs/day: 0.25  . Years: 2.00  . Quit date: 09/25/1998  Smokeless Tobacco  . Never Used     Outpatient Encounter Prescriptions as of 04/05/2017  Medication Sig  . acetaminophen (TYLENOL) 500 MG tablet Take 500-1,000 mg by mouth every 6 (six) hours as needed for mild pain or headache.  . fluticasone (FLONASE) 50 MCG/ACT nasal spray Place 2 sprays into both nostrils daily.  Marland Kitchen levonorgestrel (MIRENA) 20 MCG/24HR IUD 1 each by Intrauterine route once.  . loratadine (CLARITIN) 10 MG tablet Take 1 tablet (10 mg total)  by mouth daily.  . ondansetron (ZOFRAN) 4 MG tablet Take 1 tablet (4 mg total) by mouth every 8 (eight) hours as needed for nausea or vomiting.  Marland Kitchen oxymetazoline (AFRIN) 0.05 % nasal spray Place 1 spray into both nostrils 2 (two) times daily as needed for congestion.  . rizatriptan (MAXALT) 10 MG tablet TAKE 1 TABLET BY MOUTH AS NEEDED FOR MIGRAINE. MAY REPEAT IN 2 HOURS IF NEEDED.  Marland Kitchen sertraline (ZOLOFT) 25 MG tablet Take 1 tablet by mouth daily.  . Vitamin D, Ergocalciferol, (DRISDOL) 50000 units CAPS capsule Take 1 capsule (50,000 Units total) by mouth every 7 (seven) days.  . [DISCONTINUED] diclofenac (VOLTAREN) 75 MG EC tablet Take 1 tablet (75 mg total) by mouth 2 (two) times daily.  . [DISCONTINUED] loratadine (CLARITIN) 10 MG tablet Take 10 mg by mouth daily.  Marland Kitchen amoxicillin-clavulanate (AUGMENTIN) 875-125 MG tablet Take 1 tablet by mouth 2 (two) times daily.  . fluconazole (DIFLUCAN) 150 MG tablet Take 1 tablet (150 mg total) by mouth once.  . Hydrocod Polst-Chlorphen Polst (TUSSICAPS) 10-8 MG CP12 Take 1 capsule by mouth 2 (two) times daily as needed.   No facility-administered encounter medications on file as of 04/05/2017.     Allergies: Other; Sulfa antibiotics; Cetirizine hcl; Ofloxacin; and Sulfonamide derivatives  Body mass index is 28.55 kg/m.  Blood pressure 99/68, pulse 84, temperature 98.4 F (36.9 C), temperature source Oral, height 5' 7.5" (1.715 m), weight 185 lb (83.9 kg), unknown if currently breastfeeding.  ROS: Positive - HA, Cough, Congestion, Rhinorrhea, Fatigue, Sleep Distrubances All other systems Negative    Objective:   Physical Exam  Constitutional: She is oriented to person, place, and time. She appears well-developed and well-nourished. She appears ill. No distress.  HENT:  Head: Normocephalic and atraumatic.  Right Ear: Hearing, external ear and ear canal normal. Tympanic membrane is erythematous and bulging.  Left Ear: Hearing, external ear and ear  canal normal. Tympanic membrane is erythematous and bulging.  Nose: Mucosal edema and rhinorrhea present. Right sinus exhibits no maxillary sinus tenderness and no frontal sinus tenderness. Left sinus exhibits maxillary sinus tenderness and frontal sinus tenderness.  Mouth/Throat: Uvula is midline, oropharynx is clear and moist and mucous membranes are normal.  Eyes: Pupils are equal, round, and reactive to light. Conjunctivae are normal.  Neck: Normal range of motion. Neck supple.  Cardiovascular: Normal rate, regular rhythm, normal heart sounds and intact distal pulses.   No murmur heard. Pulmonary/Chest: Effort normal. No respiratory distress. She has no wheezes. She has no rales. She exhibits no tenderness.  Lymphadenopathy:    She has no cervical adenopathy.  Neurological: She is alert and oriented to person, place, and time.  Skin: Skin is warm and dry. No rash noted. She is not diaphoretic. No erythema. No pallor.  Psychiatric: She has a normal  mood and affect. Her behavior is normal. Judgment and thought content normal.  Nursing note and vitals reviewed.         Assessment & Plan:   1. Acute maxillary sinusitis, recurrence not specified     Sinusitis, acute maxillary Augmentin BID x 10 d [place] Controlled Substance Website verified-no contraindications to narcotic cough syrup. Do not drive or drink ETOH when using [place]. Increase fluids/rest/vit c. OTC Acetaminophen or Ibuprofen for HA/soreness. If sx's persist after ABX completed then RTC.   Return if symptoms worsen or fail to improve.  Esaw Grandchild, NP

## 2017-04-05 NOTE — Assessment & Plan Note (Signed)
Augmentin BID x 10 d [place] Controlled Substance Website verified-no contraindications to narcotic cough syrup. Do not drive or drink ETOH when using [place]. Increase fluids/rest/vit c. OTC Acetaminophen or Ibuprofen for HA/soreness. If sx's persist after ABX completed then RTC.

## 2017-04-05 NOTE — Patient Instructions (Signed)
Sinusitis, Adult Sinusitis is soreness and inflammation of your sinuses. Sinuses are hollow spaces in the bones around your face. Your sinuses are located:  Around your eyes.  In the middle of your forehead.  Behind your nose.  In your cheekbones.  Your sinuses and nasal passages are lined with a stringy fluid (mucus). Mucus normally drains out of your sinuses. When your nasal tissues become inflamed or swollen, the mucus can become trapped or blocked so air cannot flow through your sinuses. This allows bacteria, viruses, and funguses to grow, which leads to infection. Sinusitis can develop quickly and last for 7?10 days (acute) or for more than 12 weeks (chronic). Sinusitis often develops after a cold. What are the causes? This condition is caused by anything that creates swelling in the sinuses or stops mucus from draining, including:  Allergies.  Asthma.  Bacterial or viral infection.  Abnormally shaped bones between the nasal passages.  Nasal growths that contain mucus (nasal polyps).  Narrow sinus openings.  Pollutants, such as chemicals or irritants in the air.  A foreign object stuck in the nose.  A fungal infection. This is rare.  What increases the risk? The following factors may make you more likely to develop this condition:  Having allergies or asthma.  Having had a recent cold or respiratory tract infection.  Having structural deformities or blockages in your nose or sinuses.  Having a weak immune system.  Doing a lot of swimming or diving.  Overusing nasal sprays.  Smoking.  What are the signs or symptoms? The main symptoms of this condition are pain and a feeling of pressure around the affected sinuses. Other symptoms include:  Upper toothache.  Earache.  Headache.  Bad breath.  Decreased sense of smell and taste.  A cough that may get worse at night.  Fatigue.  Fever.  Thick drainage from your nose. The drainage is often green and  it may contain pus (purulent).  Stuffy nose or congestion.  Postnasal drip. This is when extra mucus collects in the throat or back of the nose.  Swelling and warmth over the affected sinuses.  Sore throat.  Sensitivity to light.  How is this diagnosed? This condition is diagnosed based on symptoms, a medical history, and a physical exam. To find out if your condition is acute or chronic, your health care provider may:  Look in your nose for signs of nasal polyps.  Tap over the affected sinus to check for signs of infection.  View the inside of your sinuses using an imaging device that has a light attached (endoscope).  If your health care provider suspects that you have chronic sinusitis, you may also:  Be tested for allergies.  Have a sample of mucus taken from your nose (nasal culture) and checked for bacteria.  Have a mucus sample examined to see if your sinusitis is related to an allergy.  If your sinusitis does not respond to treatment and it lasts longer than 8 weeks, you may have an MRI or CT scan to check your sinuses. These scans also help to determine how severe your infection is. In rare cases, a bone biopsy may be done to rule out more serious types of fungal sinus disease. How is this treated? Treatment for sinusitis depends on the cause and whether your condition is chronic or acute. If a virus is causing your sinusitis, your symptoms will go away on their own within 10 days. You may be given medicines to relieve your symptoms,   including:  Topical nasal decongestants. They shrink swollen nasal passages and let mucus drain from your sinuses.  Antihistamines. These drugs block inflammation that is triggered by allergies. This can help to ease swelling in your nose and sinuses.  Topical nasal corticosteroids. These are nasal sprays that ease inflammation and swelling in your nose and sinuses.  Nasal saline washes. These rinses can help to get rid of thick mucus in  your nose.  If your condition is caused by bacteria, you will be given an antibiotic medicine. If your condition is caused by a fungus, you will be given an antifungal medicine. Surgery may be needed to correct underlying conditions, such as narrow nasal passages. Surgery may also be needed to remove polyps. Follow these instructions at home: Medicines  Take, use, or apply over-the-counter and prescription medicines only as told by your health care provider. These may include nasal sprays.  If you were prescribed an antibiotic medicine, take it as told by your health care provider. Do not stop taking the antibiotic even if you start to feel better. Hydrate and Humidify  Drink enough water to keep your urine clear or pale yellow. Staying hydrated will help to thin your mucus.  Use a cool mist humidifier to keep the humidity level in your home above 50%.  Inhale steam for 10-15 minutes, 3-4 times a day or as told by your health care provider. You can do this in the bathroom while a hot shower is running.  Limit your exposure to cool or dry air. Rest  Rest as much as possible.  Sleep with your head raised (elevated).  Make sure to get enough sleep each night. General instructions  Apply a warm, moist washcloth to your face 3-4 times a day or as told by your health care provider. This will help with discomfort.  Wash your hands often with soap and water to reduce your exposure to viruses and other germs. If soap and water are not available, use hand sanitizer.  Do not smoke. Avoid being around people who are smoking (secondhand smoke).  Keep all follow-up visits as told by your health care provider. This is important. Contact a health care provider if:  You have a fever.  Your symptoms get worse.  Your symptoms do not improve within 10 days. Get help right away if:  You have a severe headache.  You have persistent vomiting.  You have pain or swelling around your face or  eyes.  You have vision problems.  You develop confusion.  Your neck is stiff.  You have trouble breathing. This information is not intended to replace advice given to you by your health care provider. Make sure you discuss any questions you have with your health care provider. Document Released: 09/11/2005 Document Revised: 05/07/2016 Document Reviewed: 07/07/2015 Elsevier Interactive Patient Education  2017 [employer]   Cough, Adult Coughing is a reflex that clears your throat and your airways. Coughing helps to heal and protect your lungs. It is normal to cough occasionally, but a cough that happens with other symptoms or lasts a long time may be a sign of a condition that needs treatment. A cough may last only 2-3 weeks (acute), or it may last longer than 8 weeks (chronic). What are the causes? Coughing is commonly caused by:  Breathing in substances that irritate your lungs.  A viral or bacterial respiratory infection.  Allergies.  Asthma.  Postnasal drip.  Smoking.  Acid backing up from the stomach into the esophagus (  gastroesophageal reflux).  Certain medicines.  Chronic lung problems, including COPD (or rarely, lung cancer).  Other medical conditions such as heart failure.  Follow these instructions at home: Pay attention to any changes in your symptoms. Take these actions to help with your discomfort:  Take medicines only as told by your health care provider. ? If you were prescribed an antibiotic medicine, take it as told by your health care provider. Do not stop taking the antibiotic even if you start to feel better. ? Talk with your health care provider before you take a cough suppressant medicine.  Drink enough fluid to keep your urine clear or pale yellow.  If the air is dry, use a cold steam vaporizer or humidifier in your bedroom or your home to help loosen secretions.  Avoid anything that causes you to cough at work or at home.  If your cough is  worse at night, try sleeping in a semi-upright position.  Avoid cigarette smoke. If you smoke, quit smoking. If you need help quitting, ask your health care provider.  Avoid caffeine.  Avoid alcohol.  Rest as needed.  Contact a health care provider if:  You have new symptoms.  You cough up pus.  Your cough does not get better after 2-3 weeks, or your cough gets worse.  You cannot control your cough with suppressant medicines and you are losing sleep.  You develop pain that is getting worse or pain that is not controlled with pain medicines.  You have a fever.  You have unexplained weight loss.  You have night sweats. Get help right away if:  You cough up blood.  You have difficulty breathing.  Your heartbeat is very fast. This information is not intended to replace advice given to you by your health care provider. Make sure you discuss any questions you have with your health care provider. Document Released: 03/10/2011 Document Revised: 02/17/2016 Document Reviewed: 11/18/2014 Elsevier Interactive Patient Education  2017 [employer].  Please take medications as directed. Increase fluids/rest/vit c. If symptoms persist after antibiotic completed then please all clinic. FEEL BETTER

## 2017-04-23 ENCOUNTER — Ambulatory Visit: Payer: 59 | Admitting: Adult Health

## 2017-05-25 IMAGING — MR MR LUMBAR SPINE W/O CM
4 of 5 series · 26 of 48 positions shown · non-contrast
Comparison: Two-view lumbar spine 01/09/2017.

CLINICAL DATA: Low back and RIGHT hip pain for years.

EXAM:
MRI LUMBAR SPINE WITHOUT CONTRAST
TECHNIQUE: Multiplanar, multisequence MR imaging of the lumbar spine was
performed. No intravenous contrast was administered.

[Series 3: T2 post-contrast · sagittal · 4.0mm · 0.55mm/px · 6 of 13 slices shown]
[im 1/13]
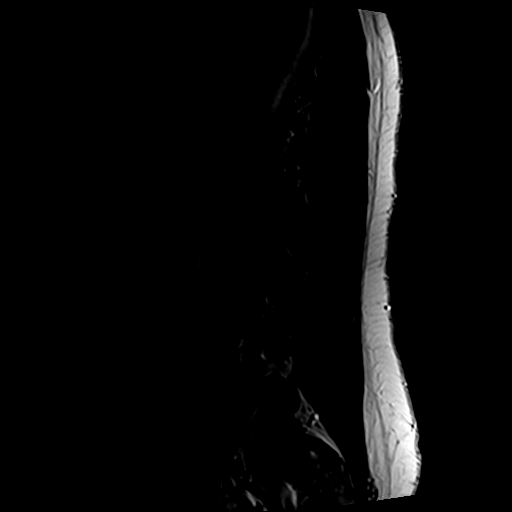
[im 3/13]
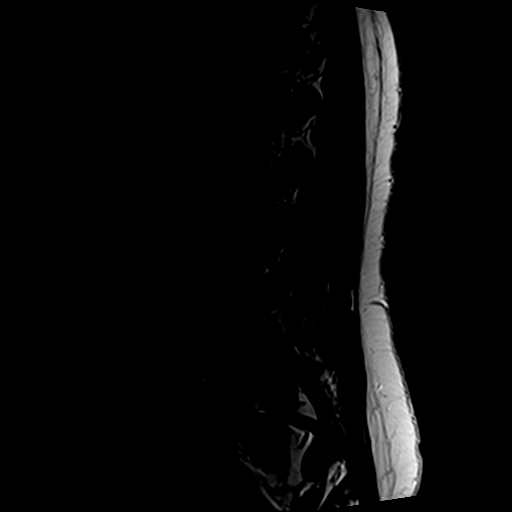
[im 5/13]
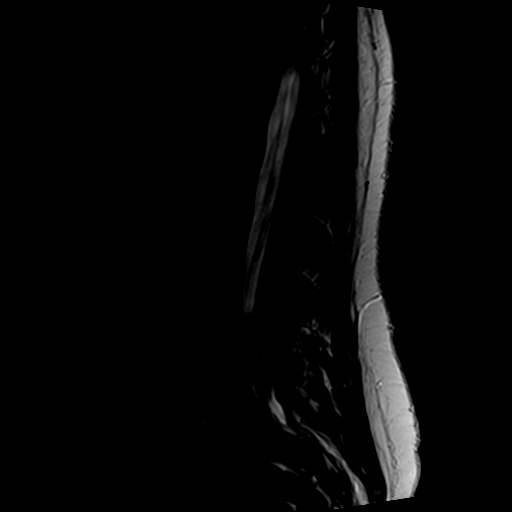
[im 8/13]
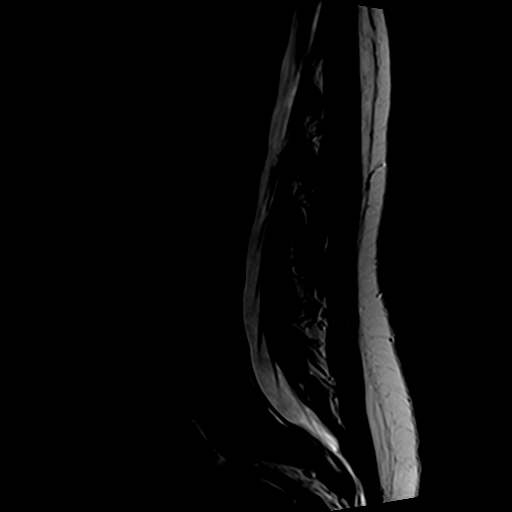
[im 10/13]
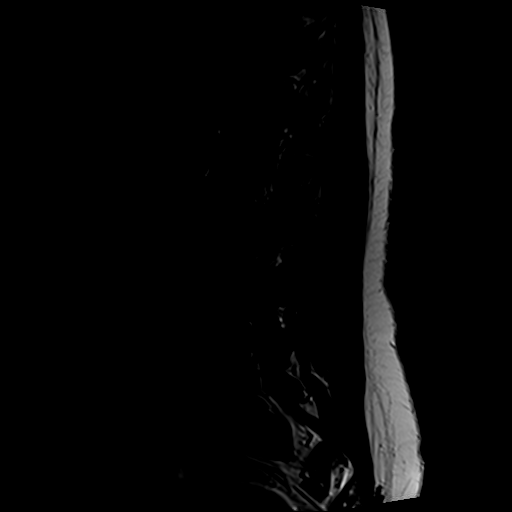
[im 13/13]
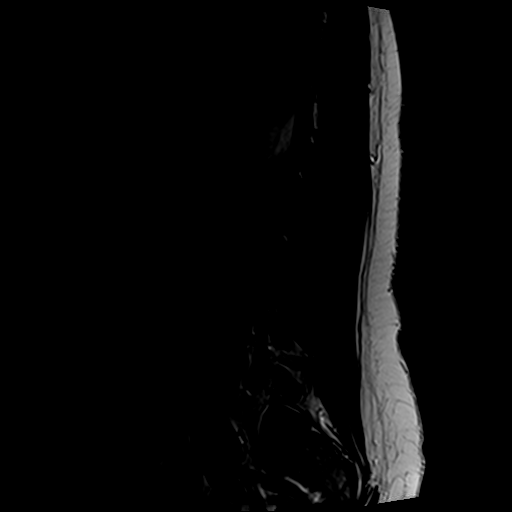

[Series 5: T1 · sagittal · 4.0mm · 0.55mm/px · 6 of 13 slices shown (1 of 2)]
[im 1/13]
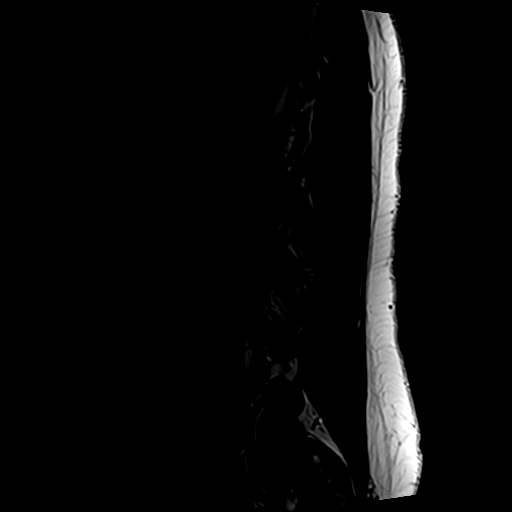
[im 3/13]
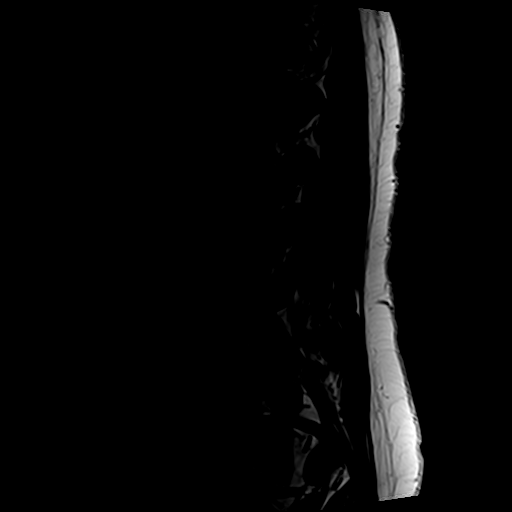
[im 5/13]
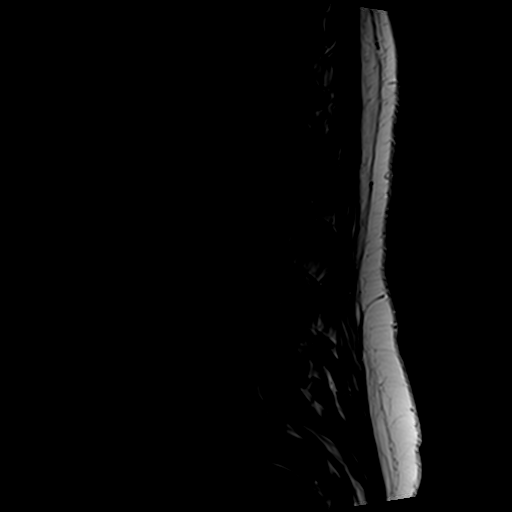
[im 8/13]
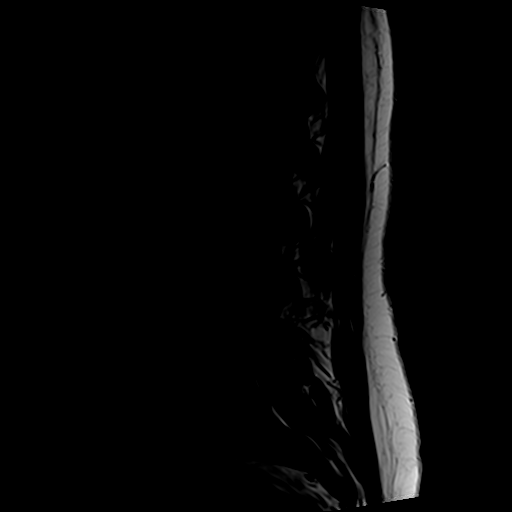
[im 10/13]
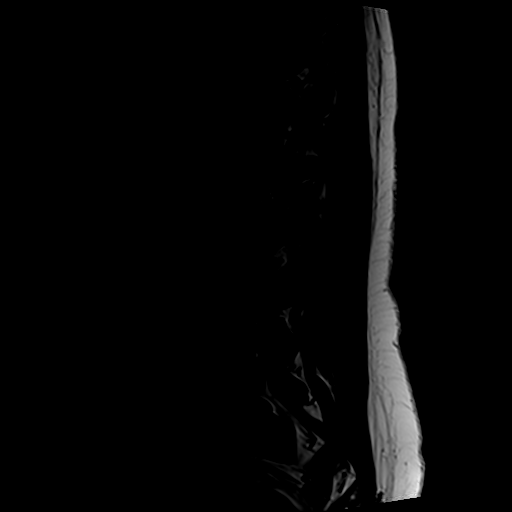
[im 13/13]
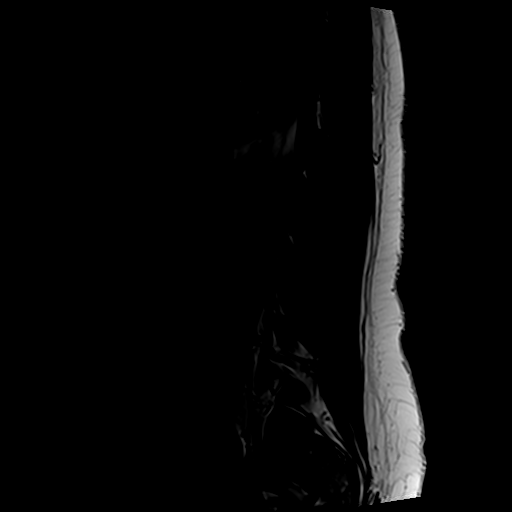

[Series 6: T1 · axial · 4.0mm · 0.35mm/px · z∈[-64,+101]mm · 5 of 34 slices shown (2 of 2)]
[im 1/34]
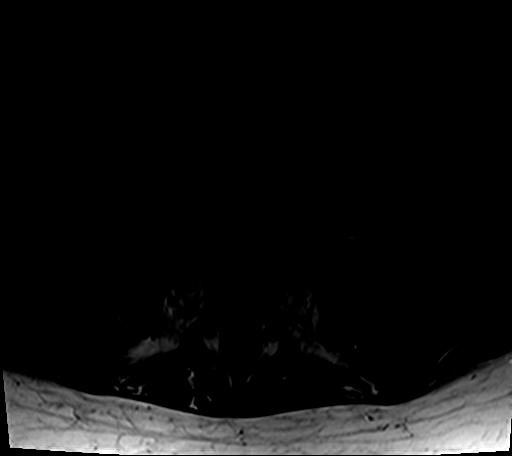
[im 5/34]
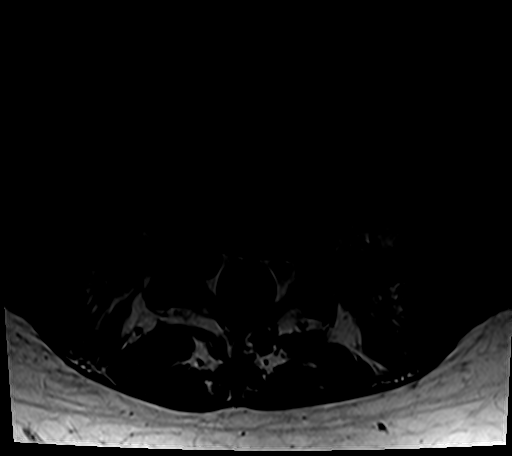
[im 10/34]
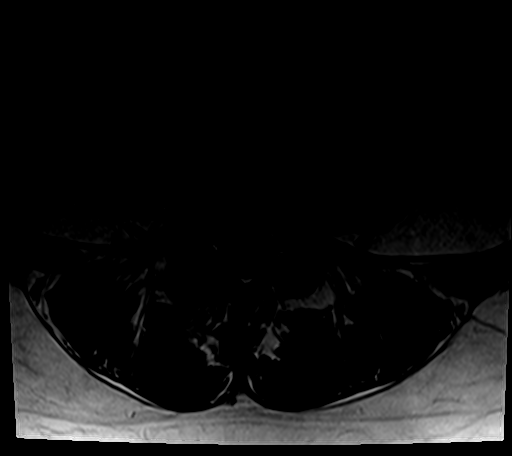
[im 17/34]
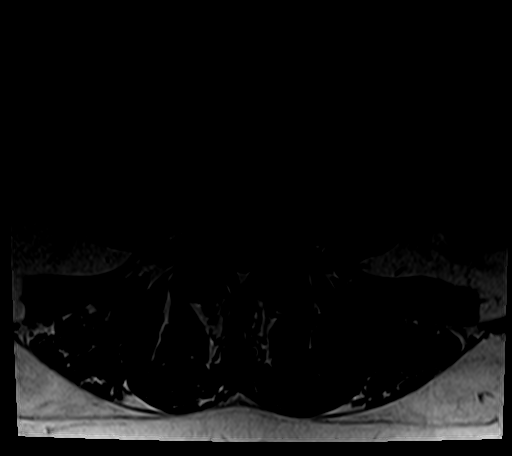
[im 29/34]
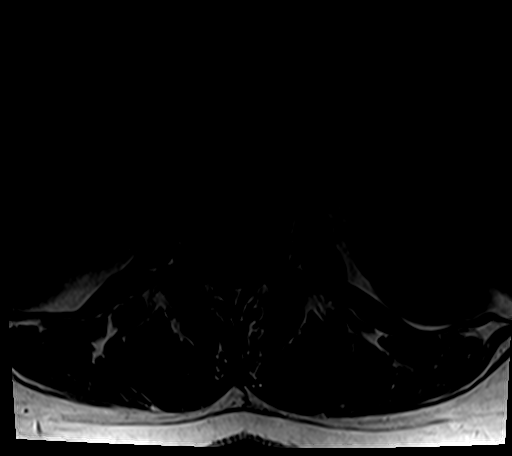

[Series 7: T2 · axial · 4.0mm · 0.70mm/px · z∈[-64,+127]mm · 9 of 34 slices shown]
[im 1/34]
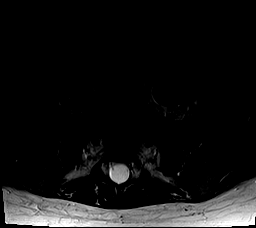
[im 5/34]
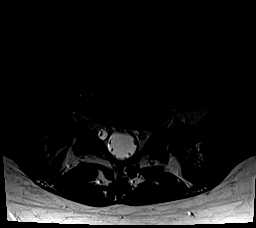
[im 10/34]
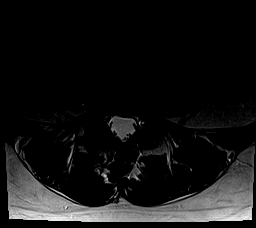
[im 15/34]
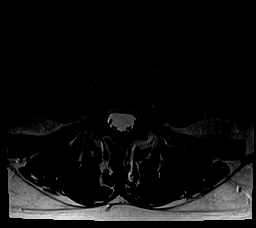
[im 17/34]
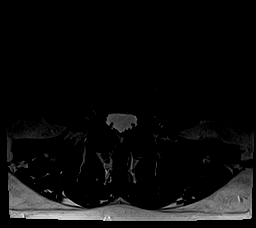
[im 19/34]
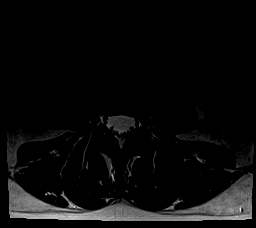
[im 24/34]
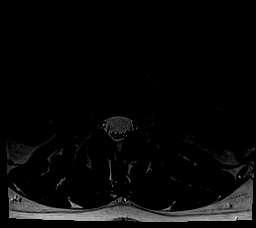
[im 29/34]
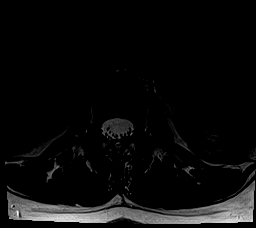
[im 34/34]
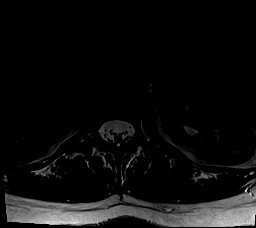

[26 of 48 positions shown; findings below may reference images not displayed]

FINDINGS: Segmentation:  Standard

Alignment:  Physiologic.

Vertebrae:  No fracture, evidence of discitis, or bone lesion.

Conus medullaris: Extends to the L1 level and appears normal.

Paraspinal and other soft tissues: Negative

Disc levels:

L1-L2:  Normal.

L2-L3:  Normal.

L3-L4:  Normal.

L4-L5:  Normal disc space.  Mild facet arthropathy.  No impingement.

L5-S1:  Annular bulge.  No impingement.
IMPRESSION: Minor lumbar spondylosis.

No disc protrusion, spinal stenosis, or significant impingement.

## 2017-06-06 ENCOUNTER — Encounter: Payer: Self-pay | Admitting: Adult Health

## 2017-06-06 ENCOUNTER — Ambulatory Visit (INDEPENDENT_AMBULATORY_CARE_PROVIDER_SITE_OTHER): Payer: 59 | Admitting: Adult Health

## 2017-06-06 VITALS — BP 92/62 | HR 91 | Ht 67.5 in | Wt 187.8 lb

## 2017-06-06 DIAGNOSIS — J0100 Acute maxillary sinusitis, unspecified: Secondary | ICD-10-CM | POA: Diagnosis not present

## 2017-06-06 MED ORDER — EPINEPHRINE 0.3 MG/0.3ML IJ SOAJ: 0.3000 mg | Freq: Once | INTRAMUSCULAR | 1 refills | Status: AC

## 2017-06-06 MED ORDER — FLUTICASONE PROPIONATE 50 MCG/ACT NA SUSP: 2.0000 | Freq: Every day | NASAL | 3 refills | Status: DC

## 2017-06-06 MED ORDER — MONTELUKAST SODIUM 10 MG PO TABS: 10.0000 mg | ORAL_TABLET | Freq: Every day | ORAL | 2 refills | Status: DC

## 2017-06-06 NOTE — Patient Instructions (Addendum)

## 2017-06-06 NOTE — Progress Notes (Signed)
Subjective:    Patient ID: [name], female    DOB: 1976-04-12, 41 y.o.   MRN: [id number]  HPI:  [name] presents with R ear "fullness" and intermittent sharp/shooting pain, rated 7/10.  She also has clear nasal congestion, non-productive cough, and R sided facial pressure that all began yesterday.  She has 3 young children that have had GI upset and URI the last 2 weeks.  She denies HA/fever/dizziness/CP/dyspnea/palpiations.   She has been using Flonase and OTC Loratadine with only minimal sx relief.  She has been pushing fluids and vit c.  Patient Care Team    Relationship Specialty Notifications Start End  Esaw Grandchild, NP PCP - General Family Medicine  11/29/16     Patient Active Problem List   Diagnosis Date Noted  . Sinusitis, acute maxillary 04/05/2017  . Pain in right hip 11/29/2016  . Health care maintenance 11/29/2016  . SVD (spontaneous vaginal delivery) 07/23/2014  . Breech extraction, delivered 07/23/2014  . Dichorionic diamniotic twin pregnancy in third trimester 07/22/2014  . GDM, class A1 07/22/2014  . Palpitations 04/15/2014  . Migraine with aura 02/16/2009  . ALLERGIC RHINITIS 02/16/2009  . PAP SMEAR, ABNORMAL 02/16/2009  . BACK STRAIN, LUMBAR 02/16/2009  . PERSONAL HISTORY OF ALLERGY TO PEANUTS 02/16/2009     Past Medical History:  Diagnosis Date  . Abnormal Pap smear   . AMA (advanced maternal age) multigravida 38+   . Breech extraction, delivered 07/23/2014  . Chronic headaches   . Dichorionic diamniotic twin pregnancy in third trimester 07/22/2014  . GDM, class A1 07/22/2014  . Gestational diabetes    diet controlled  . Heart murmur   . Palpitations   . Subfertility of couple   . SVD (spontaneous vaginal delivery) 04/19/2012  . SVD (spontaneous vaginal delivery) 07/23/2014  . Thrombocytopenia complicating pregnancy (HCC)    mild  . Vaginal Pap smear, abnormal      Past Surgical History:  Procedure Laterality Date  . COLPOSCOPY   2009  . HYSTEROSCOPY    . resection of polyp    . VAGINAL DELIVERY  07/23/2014   Procedure: VAGINAL DELIVERY;  Surgeon: Janyth Contes, MD;  Location: [facility];  Service: Obstetrics;;  Twins  . WISDOM TOOTH EXTRACTION       Family History  Problem Relation Age of Onset  . Arthritis Mother   . Depression Mother   . Hypertension Mother   . Hypothyroidism Mother   . Migraines Mother   . Miscarriages / [place] Mother   . Hypertension Sister   . Hypothyroidism Sister   . Migraines Sister   . Healthy Father   . Coronary artery disease Maternal Grandfather   . Arthritis Maternal Grandfather   . Heart disease Maternal Grandfather   . Hypertension Maternal Grandfather   . Stroke Maternal Grandfather   . Coronary artery disease Paternal Grandfather   . Arthritis Paternal Grandfather   . Heart disease Paternal Grandfather   . Hypertension Paternal Grandfather   . Stroke Paternal Grandfather   . Cancer Maternal Aunt        breast and cervical  . Depression Maternal Uncle   . Migraines Maternal Grandmother   . Arthritis Maternal Grandmother   . Hypertension Maternal Grandmother   . Stroke Maternal Grandmother   . Arthritis Paternal Grandmother   . Hypertension Paternal Grandmother   . Stroke Paternal Grandmother   . Cancer Maternal Aunt        leaukemia     History  Drug Use No     History  Alcohol Use No     History  Smoking Status  . Former Smoker  . Packs/day: 0.25  . Years: 2.00  . Quit date: 09/25/1998  Smokeless Tobacco  . Never Used     Outpatient Encounter Prescriptions as of 06/06/2017  Medication Sig  . acetaminophen (TYLENOL) 500 MG tablet Take 500-1,000 mg by mouth every 6 (six) hours as needed for mild pain or headache.  . Cholecalciferol (VITAMIN D3) 5000 units CAPS Take 1 capsule by mouth daily.  . fluticasone (FLONASE) 50 MCG/ACT nasal spray Place 2 sprays into both nostrils daily.  Marland Kitchen levonorgestrel (MIRENA) 20 MCG/24HR IUD 1 each by  Intrauterine route once.  . loratadine (CLARITIN) 10 MG tablet Take 1 tablet (10 mg total) by mouth daily.  . ondansetron (ZOFRAN) 4 MG tablet Take 1 tablet (4 mg total) by mouth every 8 (eight) hours as needed for nausea or vomiting.  Marland Kitchen oxymetazoline (AFRIN) 0.05 % nasal spray Place 1 spray into both nostrils 2 (two) times daily as needed for congestion.  . rizatriptan (MAXALT) 10 MG tablet TAKE 1 TABLET BY MOUTH AS NEEDED FOR MIGRAINE. MAY REPEAT IN 2 HOURS IF NEEDED.  Marland Kitchen sertraline (ZOLOFT) 25 MG tablet Take 1 tablet by mouth daily.  . [DISCONTINUED] amoxicillin-clavulanate (AUGMENTIN) 875-125 MG tablet Take 1 tablet by mouth 2 (two) times daily.  . [DISCONTINUED] chlorpheniramine-HYDROcodone (TUSSIONEX PENNKINETIC ER) 10-8 MG/5ML SUER Take 5 mLs by mouth every 12 (twelve) hours as needed for cough.  . [DISCONTINUED] Vitamin D, Ergocalciferol, (DRISDOL) 50000 units CAPS capsule Take 1 capsule (50,000 Units total) by mouth every 7 (seven) days.   No facility-administered encounter medications on file as of 06/06/2017.     Allergies: Other; Sulfa antibiotics; Cetirizine hcl; Ofloxacin; and Sulfonamide derivatives  Body mass index is 28.98 kg/m.  Blood pressure 92/62, pulse 91, height 5' 7.5" (1.715 m), weight 187 lb 12.8 oz (85.2 kg), unknown if currently breastfeeding.      Review of Systems  Constitutional: Positive for fatigue. Negative for activity change, appetite change, chills, diaphoresis, fever and unexpected weight change.  HENT: Positive for congestion, ear pain, postnasal drip, rhinorrhea, sinus pressure, sneezing and voice change. Negative for ear discharge, sinus pain, sore throat and trouble swallowing.   Eyes: Negative for visual disturbance.  Respiratory: Positive for cough. Negative for chest tightness, shortness of breath, wheezing and stridor.   Cardiovascular: Negative for chest pain, palpitations and leg swelling.  Skin: Negative for color change, pallor, rash and  wound.  Neurological: Negative for dizziness and headaches.  Hematological: Does not bruise/bleed easily.       Objective:   Physical Exam  Constitutional: She is oriented to person, place, and time. She appears well-developed and well-nourished. No distress.  HENT:  Head: Normocephalic and atraumatic.  Right Ear: Hearing and external ear normal. Tympanic membrane is bulging. Tympanic membrane is not erythematous. No decreased hearing is noted.  Left Ear: External ear normal. Tympanic membrane is not erythematous and not bulging. No decreased hearing is noted.  Nose: Mucosal edema and rhinorrhea present. Right sinus exhibits maxillary sinus tenderness and frontal sinus tenderness. Left sinus exhibits no frontal sinus tenderness.  Mouth/Throat: Uvula is midline and mucous membranes are normal. Oropharyngeal exudate and posterior oropharyngeal erythema present. No posterior oropharyngeal edema or tonsillar abscesses.  Eyes: Pupils are equal, round, and reactive to light. Conjunctivae are normal.  Neck: Normal range of motion. Neck supple.  Cardiovascular: Normal rate, regular rhythm,  normal heart sounds and intact distal pulses.   No murmur heard. Pulmonary/Chest: Effort normal and breath sounds normal. No respiratory distress. She has no wheezes. She has no rales. She exhibits no tenderness.  Lymphadenopathy:    She has no cervical adenopathy.  Neurological: She is alert and oriented to person, place, and time.  Skin: Skin is warm and dry. No rash noted. She is not diaphoretic. No erythema. No pallor.  Psychiatric: She has a normal mood and affect. Her behavior is normal. Judgment and thought content normal.  Nursing note and vitals reviewed.         Assessment & Plan:   1. Acute maxillary sinusitis, recurrence not specified     Sinusitis, acute maxillary Increase fluids/rest/vit c. Continue Flonase. Stop Loratadine and start Montelukast 26m daily. Alternate OTC  Acetaminophen/Ibuprofen for aches/pain. If symptoms persist after Monday, please call clinic and I will send in ABX-Augmentin.    FOLLOW-UP:  Return if symptoms worsen or fail to improve.

## 2017-06-06 NOTE — Assessment & Plan Note (Signed)
Increase fluids/rest/vit c. Continue Flonase. Stop Loratadine and start Montelukast 62m daily. Alternate OTC Acetaminophen/Ibuprofen for aches/pain. If symptoms persist after Monday, please call clinic and I will send in ABX-Augmentin.

## 2017-06-14 ENCOUNTER — Encounter: Payer: Self-pay | Admitting: Adult Health

## 2017-06-14 ENCOUNTER — Other Ambulatory Visit: Payer: Self-pay | Admitting: Adult Health

## 2017-06-14 DIAGNOSIS — H6690 Otitis media, unspecified, unspecified ear: Secondary | ICD-10-CM

## 2017-06-14 MED ORDER — AMOXICILLIN-POT CLAVULANATE 875-125 MG PO TABS: 1.0000 | ORAL_TABLET | Freq: Two times a day (BID) | ORAL | 0 refills | Status: DC

## 2017-06-27 ENCOUNTER — Ambulatory Visit (INDEPENDENT_AMBULATORY_CARE_PROVIDER_SITE_OTHER): Payer: 59

## 2017-06-27 VITALS — BP 113/72 | HR 80 | Temp 98.4°F

## 2017-06-27 DIAGNOSIS — Z23 Encounter for immunization: Secondary | ICD-10-CM | POA: Diagnosis not present

## 2017-06-27 NOTE — Progress Notes (Signed)
Pt here for influenza vaccine.  Screening questionnaire reviewed, VIS provided to patient, and any/all patient questions answered.  T. Orrin Yurkovich, CMA  

## 2017-07-13 ENCOUNTER — Other Ambulatory Visit: Payer: Self-pay | Admitting: Adult Health

## 2017-08-02 ENCOUNTER — Encounter: Payer: Self-pay | Admitting: Adult Health

## 2017-10-19 LAB — HM MAMMOGRAPHY

## 2017-10-19 LAB — HM PAP SMEAR: HM PAP: NEGATIVE

## 2017-11-05 ENCOUNTER — Encounter: Payer: Self-pay | Admitting: Adult Health

## 2017-11-05 MED ORDER — OSELTAMIVIR PHOSPHATE 75 MG PO CAPS
75.0000 mg | ORAL_CAPSULE | Freq: Two times a day (BID) | ORAL | 0 refills | Status: DC
Start: 2017-11-05 — End: 2017-12-05

## 2017-11-05 NOTE — Telephone Encounter (Signed)
Good Afternoon Levada Dy, Thank you for pending the rx. Can you please call the pt and tell her to take rx BID x 5 days Push fluids/rest/vit c and alternate OTC Acetaminophen and Ibuprofen. Thanks! Valetta Fuller

## 2017-11-05 NOTE — Telephone Encounter (Signed)
Patient advised of recommendations.  

## 2017-12-05 ENCOUNTER — Encounter: Payer: Self-pay | Admitting: Adult Health

## 2017-12-05 ENCOUNTER — Ambulatory Visit (INDEPENDENT_AMBULATORY_CARE_PROVIDER_SITE_OTHER): Payer: 59 | Admitting: Adult Health

## 2017-12-05 VITALS — BP 88/57 | HR 74 | Temp 98.1°F | Ht 67.5 in | Wt 189.6 lb

## 2017-12-05 DIAGNOSIS — R05 Cough: Secondary | ICD-10-CM | POA: Diagnosis not present

## 2017-12-05 DIAGNOSIS — R059 Cough, unspecified: Secondary | ICD-10-CM

## 2017-12-05 DIAGNOSIS — J0100 Acute maxillary sinusitis, unspecified: Secondary | ICD-10-CM

## 2017-12-05 MED ORDER — AMOXICILLIN-POT CLAVULANATE 875-125 MG PO TABS: 1.0000 | ORAL_TABLET | Freq: Two times a day (BID) | ORAL | 0 refills | Status: DC

## 2017-12-05 MED ORDER — HYDROCOD POLST-CPM POLST ER 10-8 MG/5ML PO SUER: 5.0000 mL | Freq: Two times a day (BID) | ORAL | 0 refills | Status: DC | PRN

## 2017-12-05 MED ORDER — FLUTICASONE PROPIONATE 50 MCG/ACT NA SUSP: 2.0000 | Freq: Every day | NASAL | 3 refills | Status: DC

## 2017-12-05 NOTE — Patient Instructions (Signed)
Sinusitis, Adult Sinusitis is soreness and inflammation of your sinuses. Sinuses are hollow spaces in the bones around your face. Your sinuses are located:  Around your eyes.  In the middle of your forehead.  Behind your nose.  In your cheekbones.  Your sinuses and nasal passages are lined with a stringy fluid (mucus). Mucus normally drains out of your sinuses. When your nasal tissues become inflamed or swollen, the mucus can become trapped or blocked so air cannot flow through your sinuses. This allows bacteria, viruses, and funguses to grow, which leads to infection. Sinusitis can develop quickly and last for 7?10 days (acute) or for more than 12 weeks (chronic). Sinusitis often develops after a cold. What are the causes? This condition is caused by anything that creates swelling in the sinuses or stops mucus from draining, including:  Allergies.  Asthma.  Bacterial or viral infection.  Abnormally shaped bones between the nasal passages.  Nasal growths that contain mucus (nasal polyps).  Narrow sinus openings.  Pollutants, such as chemicals or irritants in the air.  A foreign object stuck in the nose.  A fungal infection. This is rare.  What increases the risk? The following factors may make you more likely to develop this condition:  Having allergies or asthma.  Having had a recent cold or respiratory tract infection.  Having structural deformities or blockages in your nose or sinuses.  Having a weak immune system.  Doing a lot of swimming or diving.  Overusing nasal sprays.  Smoking.  What are the signs or symptoms? The main symptoms of this condition are pain and a feeling of pressure around the affected sinuses. Other symptoms include:  Upper toothache.  Earache.  Headache.  Bad breath.  Decreased sense of smell and taste.  A cough that may get worse at night.  Fatigue.  Fever.  Thick drainage from your nose. The drainage is often green and  it may contain pus (purulent).  Stuffy nose or congestion.  Postnasal drip. This is when extra mucus collects in the throat or back of the nose.  Swelling and warmth over the affected sinuses.  Sore throat.  Sensitivity to light.  How is this diagnosed? This condition is diagnosed based on symptoms, a medical history, and a physical exam. To find out if your condition is acute or chronic, your health care provider may:  Look in your nose for signs of nasal polyps.  Tap over the affected sinus to check for signs of infection.  View the inside of your sinuses using an imaging device that has a light attached (endoscope).  If your health care provider suspects that you have chronic sinusitis, you may also:  Be tested for allergies.  Have a sample of mucus taken from your nose (nasal culture) and checked for bacteria.  Have a mucus sample examined to see if your sinusitis is related to an allergy.  If your sinusitis does not respond to treatment and it lasts longer than 8 weeks, you may have an MRI or CT scan to check your sinuses. These scans also help to determine how severe your infection is. In rare cases, a bone biopsy may be done to rule out more serious types of fungal sinus disease. How is this treated? Treatment for sinusitis depends on the cause and whether your condition is chronic or acute. If a virus is causing your sinusitis, your symptoms will go away on their own within 10 days. You may be given medicines to relieve your symptoms,   including:  Topical nasal decongestants. They shrink swollen nasal passages and let mucus drain from your sinuses.  Antihistamines. These drugs block inflammation that is triggered by allergies. This can help to ease swelling in your nose and sinuses.  Topical nasal corticosteroids. These are nasal sprays that ease inflammation and swelling in your nose and sinuses.  Nasal saline washes. These rinses can help to get rid of thick mucus in  your nose.  If your condition is caused by bacteria, you will be given an antibiotic medicine. If your condition is caused by a fungus, you will be given an antifungal medicine. Surgery may be needed to correct underlying conditions, such as narrow nasal passages. Surgery may also be needed to remove polyps. Follow these instructions at home: Medicines  Take, use, or apply over-the-counter and prescription medicines only as told by your health care provider. These may include nasal sprays.  If you were prescribed an antibiotic medicine, take it as told by your health care provider. Do not stop taking the antibiotic even if you start to feel better. Hydrate and Humidify  Drink enough water to keep your urine clear or pale yellow. Staying hydrated will help to thin your mucus.  Use a cool mist humidifier to keep the humidity level in your home above 50%.  Inhale steam for 10-15 minutes, 3-4 times a day or as told by your health care provider. You can do this in the bathroom while a hot shower is running.  Limit your exposure to cool or dry air. Rest  Rest as much as possible.  Sleep with your head raised (elevated).  Make sure to get enough sleep each night. General instructions  Apply a warm, moist washcloth to your face 3-4 times a day or as told by your health care provider. This will help with discomfort.  Wash your hands often with soap and water to reduce your exposure to viruses and other germs. If soap and water are not available, use hand sanitizer.  Do not smoke. Avoid being around people who are smoking (secondhand smoke).  Keep all follow-up visits as told by your health care provider. This is important. Contact a health care provider if:  You have a fever.  Your symptoms get worse.  Your symptoms do not improve within 10 days. Get help right away if:  You have a severe headache.  You have persistent vomiting.  You have pain or swelling around your face or  eyes.  You have vision problems.  You develop confusion.  Your neck is stiff.  You have trouble breathing. This information is not intended to replace advice given to you by your health care provider. Make sure you discuss any questions you have with your health care provider. Document Released: 09/11/2005 Document Revised: 05/07/2016 Document Reviewed: 07/07/2015 Elsevier Interactive Patient Education  2018 [employer].    Cough, Adult Coughing is a reflex that clears your throat and your airways. Coughing helps to heal and protect your lungs. It is normal to cough occasionally, but a cough that happens with other symptoms or lasts a long time may be a sign of a condition that needs treatment. A cough may last only 2-3 weeks (acute), or it may last longer than 8 weeks (chronic). What are the causes? Coughing is commonly caused by: Breathing in substances that irritate your lungs. A viral or bacterial respiratory infection. Allergies. Asthma. Postnasal drip. Smoking. Acid backing up from the stomach into the esophagus (gastroesophageal reflux). Certain medicines. Chronic lung  problems, including COPD (or rarely, lung cancer). Other medical conditions such as heart failure.  Follow these instructions at home: Pay attention to any changes in your symptoms. Take these actions to help with your discomfort: Take medicines only as told by your health care provider. If you were prescribed an antibiotic medicine, take it as told by your health care provider. Do not stop taking the antibiotic even if you start to feel better. Talk with your health care provider before you take a cough suppressant medicine. Drink enough fluid to keep your urine clear or pale yellow. If the air is dry, use a cold steam vaporizer or humidifier in your bedroom or your home to help loosen secretions. Avoid anything that causes you to cough at work or at home. If your cough is worse at night, try sleeping in  a semi-upright position. Avoid cigarette smoke. If you smoke, quit smoking. If you need help quitting, ask your health care provider. Avoid caffeine. Avoid alcohol. Rest as needed.  Contact a health care provider if: You have new symptoms. You cough up pus. Your cough does not get better after 2-3 weeks, or your cough gets worse. You cannot control your cough with suppressant medicines and you are losing sleep. You develop pain that is getting worse or pain that is not controlled with pain medicines. You have a fever. You have unexplained weight loss. You have night sweats. Get help right away if: You cough up blood. You have difficulty breathing. Your heartbeat is very fast. This information is not intended to replace advice given to you by your health care provider. Make sure you discuss any questions you have with your health care provider. Document Released: 03/10/2011 Document Revised: 02/17/2016 Document Reviewed: 11/18/2014 Elsevier Interactive Patient Education  2018 [employer].  Augmentin, Flonase, Tussionex as directed. Increase fluids/rest/vit- c 2,011m/day Alternate OTC Acetaminophen/Ibuprofen as needed for fever/aches. If symptoms persist after antibiotic completed, then please call clinic. FEEL BETTER!

## 2017-12-05 NOTE — Assessment & Plan Note (Signed)
Augmentin, Flonase, Tussionex as directed. Increase fluids/rest/vit- c 2,035m/day Alternate OTC Acetaminophen/Ibuprofen as needed for fever/aches. If symptoms persist after antibiotic completed, then please call clinic.

## 2017-12-05 NOTE — Progress Notes (Signed)
Subjective:    Patient ID: [name], female    DOB: November 27, 1975, 42 y.o.   MRN: [id number]  HPI:  Ms. Hemm presents with productive cough (thick yellow/brown mucus), copious clear nasal drainage, frontal HA (5/10), extreme fatigue, intermittent nausea, intermittent low grade fever (101)- all occurring > 1.5 weeks that is steadily worsening. She denies CP/dyspnea at rest/palpiations/vomiting/constipation/diarrhea. She has been been increasing water and rest. She has three young children at home and reports "they have all taken turns being sick".  Patient Care Team    Relationship Specialty Notifications Start End  Esaw Grandchild, NP PCP - General Family Medicine  11/29/16     Patient Active Problem List   Diagnosis Date Noted  . Cough 12/05/2017  . Sinusitis, acute maxillary 04/05/2017  . Pain in right hip 11/29/2016  . Health care maintenance 11/29/2016  . SVD (spontaneous vaginal delivery) 07/23/2014  . Breech extraction, delivered 07/23/2014  . Dichorionic diamniotic twin pregnancy in third trimester 07/22/2014  . GDM, class A1 07/22/2014  . Palpitations 04/15/2014  . Migraine with aura 02/16/2009  . ALLERGIC RHINITIS 02/16/2009  . PAP SMEAR, ABNORMAL 02/16/2009  . BACK STRAIN, LUMBAR 02/16/2009  . PERSONAL HISTORY OF ALLERGY TO PEANUTS 02/16/2009     Past Medical History:  Diagnosis Date  . Abnormal Pap smear   . AMA (advanced maternal age) multigravida 19+   . Breech extraction, delivered 07/23/2014  . Chronic headaches   . Dichorionic diamniotic twin pregnancy in third trimester 07/22/2014  . GDM, class A1 07/22/2014  . Gestational diabetes    diet controlled  . Heart murmur   . Palpitations   . Subfertility of couple   . SVD (spontaneous vaginal delivery) 04/19/2012  . SVD (spontaneous vaginal delivery) 07/23/2014  . Thrombocytopenia complicating pregnancy (HCC)    mild  . Vaginal Pap smear, abnormal      Past Surgical History:  Procedure  Laterality Date  . COLPOSCOPY  2009  . HYSTEROSCOPY    . resection of polyp    . VAGINAL DELIVERY  07/23/2014   Procedure: VAGINAL DELIVERY;  Surgeon: Janyth Contes, MD;  Location: [facility];  Service: Obstetrics;;  Twins  . WISDOM TOOTH EXTRACTION       Family History  Problem Relation Age of Onset  . Arthritis Mother   . Depression Mother   . Hypertension Mother   . Hypothyroidism Mother   . Migraines Mother   . Miscarriages / [place] Mother   . Hypertension Sister   . Hypothyroidism Sister   . Migraines Sister   . Healthy Father   . Coronary artery disease Maternal Grandfather   . Arthritis Maternal Grandfather   . Heart disease Maternal Grandfather   . Hypertension Maternal Grandfather   . Stroke Maternal Grandfather   . Coronary artery disease Paternal Grandfather   . Arthritis Paternal Grandfather   . Heart disease Paternal Grandfather   . Hypertension Paternal Grandfather   . Stroke Paternal Grandfather   . Cancer Maternal Aunt        breast and cervical  . Depression Maternal Uncle   . Migraines Maternal Grandmother   . Arthritis Maternal Grandmother   . Hypertension Maternal Grandmother   . Stroke Maternal Grandmother   . Arthritis Paternal Grandmother   . Hypertension Paternal Grandmother   . Stroke Paternal Grandmother   . Cancer Maternal Aunt        leaukemia     Social History   Substance and Sexual Activity  Drug  Use No     Social History   Substance and Sexual Activity  Alcohol Use No     Social History   Tobacco Use  Smoking Status Former Smoker  . Packs/day: 0.25  . Years: 2.00  . Pack years: 0.50  . Last attempt to quit: 09/25/1998  . Years since quitting: 19.2  Smokeless Tobacco Never Used     Outpatient Encounter Medications as of 12/05/2017  Medication Sig  . acetaminophen (TYLENOL) 500 MG tablet Take 500-1,000 mg by mouth every 6 (six) hours as needed for mild pain or headache.  . Cholecalciferol (VITAMIN D3) 5000  units CAPS Take 1 capsule by mouth daily.  . fluticasone (FLONASE) 50 MCG/ACT nasal spray Place 2 sprays into both nostrils daily.  Marland Kitchen levonorgestrel (MIRENA) 20 MCG/24HR IUD 1 each by Intrauterine route once.  . montelukast (SINGULAIR) 10 MG tablet Take 1 tablet (10 mg total) by mouth at bedtime.  . ondansetron (ZOFRAN) 4 MG tablet Take 1 tablet (4 mg total) by mouth every 8 (eight) hours as needed for nausea or vomiting.  Marland Kitchen oxymetazoline (AFRIN) 0.05 % nasal spray Place 1 spray into both nostrils 2 (two) times daily as needed for congestion.  . rizatriptan (MAXALT) 10 MG tablet TAKE 1 TABLET BY MOUTH AS NEEDED FOR MIGRAINE. MAY REPEAT IN 2 HOURS IF NEEDED.  Marland Kitchen sertraline (ZOLOFT) 25 MG tablet Take 1 tablet by mouth daily.  . [DISCONTINUED] fluticasone (FLONASE) 50 MCG/ACT nasal spray Place 2 sprays into both nostrils daily.  Marland Kitchen amoxicillin-clavulanate (AUGMENTIN) 875-125 MG tablet Take 1 tablet by mouth 2 (two) times daily.  . chlorpheniramine-HYDROcodone (TUSSIONEX) 10-8 MG/5ML SUER Take 5 mLs by mouth every 12 (twelve) hours as needed for cough.  . [DISCONTINUED] amoxicillin-clavulanate (AUGMENTIN) 875-125 MG tablet Take 1 tablet by mouth 2 (two) times daily.  . [DISCONTINUED] oseltamivir (TAMIFLU) 75 MG capsule Take 1 capsule (75 mg total) by mouth 2 (two) times daily.   No facility-administered encounter medications on file as of 12/05/2017.     Allergies: Other; Sulfa antibiotics; Cetirizine hcl; Ofloxacin; and Sulfonamide derivatives  Body mass index is 29.26 kg/m.  Blood pressure (!) 88/57, pulse 74, temperature 98.1 F (36.7 C), temperature source Oral, height 5' 7.5" (1.715 m), weight 189 lb 9.6 oz (86 kg), SpO2 97 %, unknown if currently breastfeeding.  Her BP normally runs on lower side, last reading 92/62, HR 91 at 06/06/17 OV   Review of Systems  Constitutional: Positive for activity change, appetite change, fatigue and fever. Negative for chills, diaphoresis and unexpected  weight change.  HENT: Positive for congestion, postnasal drip, rhinorrhea, sinus pressure, sinus pain and voice change. Negative for sneezing and sore throat.   Eyes: Negative for visual disturbance.  Respiratory: Positive for cough. Negative for chest tightness, shortness of breath, wheezing and stridor.   Cardiovascular: Negative for chest pain, palpitations and leg swelling.  Gastrointestinal: Negative for abdominal distention, abdominal pain, anal bleeding, constipation, diarrhea, nausea and vomiting.  Endocrine: Negative for cold intolerance, heat intolerance, polydipsia, polyphagia and polyuria.  Genitourinary: Negative for difficulty urinating and flank pain.  Neurological: Positive for headaches. Negative for dizziness.  Psychiatric/Behavioral: Positive for sleep disturbance.       Objective:   Physical Exam  Constitutional: She is oriented to person, place, and time. She appears well-developed and well-nourished. No distress.  HENT:  Head: Normocephalic and atraumatic.  Right Ear: Hearing, external ear and ear canal normal. Tympanic membrane is bulging. Tympanic membrane is not erythematous. No decreased hearing  is noted.  Left Ear: Hearing and external ear normal. Tympanic membrane is bulging. Tympanic membrane is not erythematous. No decreased hearing is noted.  Nose: Mucosal edema and rhinorrhea present. Right sinus exhibits maxillary sinus tenderness and frontal sinus tenderness. Left sinus exhibits maxillary sinus tenderness and frontal sinus tenderness.  Mouth/Throat: Uvula is midline and mucous membranes are normal. Posterior oropharyngeal edema and posterior oropharyngeal erythema present. No oropharyngeal exudate or tonsillar abscesses.  Eyes: Conjunctivae are normal. Pupils are equal, round, and reactive to light.  Neck: Normal range of motion. Neck supple.  Cardiovascular: Normal rate, regular rhythm, normal heart sounds and intact distal pulses.  No murmur  heard. Pulmonary/Chest: Effort normal. No respiratory distress. She has no wheezes. She has no rales. She exhibits no tenderness.  Lymphadenopathy:    She has no cervical adenopathy.  Neurological: She is alert and oriented to person, place, and time.  Skin: Skin is warm and dry. No rash noted. She is not diaphoretic. No erythema. No pallor.  Psychiatric: She has a normal mood and affect. Her behavior is normal. Judgment and thought content normal.  Nursing note and vitals reviewed.         Assessment & Plan:   1. Acute maxillary sinusitis, recurrence not specified   2. Cough     Sinusitis, acute maxillary Augmentin, Flonase, Tussionex as directed. Increase fluids/rest/vit- c 2,065m/day Alternate OTC Acetaminophen/Ibuprofen as needed for fever/aches. If symptoms persist after antibiotic completed, then please call clinic.  [place]- no contraindications noted. Tussionex as directed.     FOLLOW-UP:  Return if symptoms worsen or fail to improve.

## 2017-12-05 NOTE — Assessment & Plan Note (Addendum)
[place] Controlled Substance Database reviewed- no contraindications noted. Tussionex as directed.

## 2017-12-19 ENCOUNTER — Other Ambulatory Visit: Payer: Self-pay | Admitting: Adult Health

## 2017-12-21 ENCOUNTER — Telehealth: Payer: Self-pay | Admitting: Adult Health

## 2017-12-21 NOTE — Telephone Encounter (Signed)
Pt called states she has completed Rx meds , unfortunately symptoms have returned--- Patient wonders if should have a return OV or if provider will call in Rx , pls call her @ [phone].   --glh

## 2017-12-21 NOTE — Telephone Encounter (Signed)
Per [name]'s last note, pt needs OV.  Advised pt, however both of our providers are out of the office this afternoon and 12/24/17.  Advised pt that if she feels she cannot wait to be seen for this problem until Tuesday, she should be seen at a [facility].  However, if she feels that she can wait until Valetta Fuller returns on 12/25/17 to call our office that morning to schedule an appt.  Pt expressed understanding and is agreeable.  Charyl Bigger, CMA

## 2018-01-30 ENCOUNTER — Other Ambulatory Visit: Payer: Self-pay | Admitting: Adult Health

## 2018-02-22 ENCOUNTER — Other Ambulatory Visit: Payer: Self-pay | Admitting: Adult Health

## 2018-02-27 ENCOUNTER — Encounter: Payer: Self-pay | Admitting: Adult Health

## 2018-02-27 ENCOUNTER — Ambulatory Visit (INDEPENDENT_AMBULATORY_CARE_PROVIDER_SITE_OTHER): Payer: 59 | Admitting: Adult Health

## 2018-02-27 VITALS — BP 101/69 | HR 92 | Ht 67.5 in | Wt 196.3 lb

## 2018-02-27 DIAGNOSIS — J0100 Acute maxillary sinusitis, unspecified: Secondary | ICD-10-CM

## 2018-02-27 DIAGNOSIS — Z0000 Encounter for general adult medical examination without abnormal findings: Secondary | ICD-10-CM | POA: Diagnosis not present

## 2018-02-27 DIAGNOSIS — R05 Cough: Secondary | ICD-10-CM | POA: Diagnosis not present

## 2018-02-27 DIAGNOSIS — R059 Cough, unspecified: Secondary | ICD-10-CM

## 2018-02-27 MED ORDER — MONTELUKAST SODIUM 10 MG PO TABS: 10.0000 mg | ORAL_TABLET | Freq: Every day | ORAL | 2 refills | Status: AC

## 2018-02-27 MED ORDER — SERTRALINE HCL 25 MG PO TABS: 25.0000 mg | ORAL_TABLET | Freq: Every day | ORAL | 2 refills | Status: AC

## 2018-02-27 MED ORDER — AMOXICILLIN-POT CLAVULANATE 875-125 MG PO TABS: 1.0000 | ORAL_TABLET | Freq: Two times a day (BID) | ORAL | 0 refills | Status: AC

## 2018-02-27 MED ORDER — ONDANSETRON HCL 4 MG PO TABS: ORAL_TABLET | ORAL | 3 refills | Status: AC

## 2018-02-27 MED ORDER — HYDROCOD POLST-CPM POLST ER 10-8 MG/5ML PO SUER: 5.0000 mL | Freq: Two times a day (BID) | ORAL | 0 refills | Status: AC | PRN

## 2018-02-27 MED ORDER — RIZATRIPTAN BENZOATE 10 MG PO TABS: ORAL_TABLET | ORAL | 0 refills | Status: DC

## 2018-02-27 MED ORDER — FLUTICASONE PROPIONATE 50 MCG/ACT NA SUSP: 2.0000 | Freq: Every day | NASAL | 3 refills | Status: AC

## 2018-02-27 NOTE — Patient Instructions (Addendum)
Migraine Headache A migraine headache is an intense, throbbing pain on one side or both sides of the head. Migraines may also cause other symptoms, such as nausea, vomiting, and sensitivity to light and noise. What are the causes? Doing or taking certain things may also trigger migraines, such as:  Alcohol.  Smoking.  Medicines, such as: ? Medicine used to treat chest pain (nitroglycerine). ? Birth control pills. ? Estrogen pills. ? Certain blood pressure medicines.  Aged cheeses, chocolate, or caffeine.  Foods or drinks that contain nitrates, glutamate, aspartame, or tyramine.  Physical activity.  Other things that may trigger a migraine include:  Menstruation.  Pregnancy.  Hunger.  Stress, lack of sleep, too much sleep, or fatigue.  Weather changes.  What increases the risk? The following factors may make you more likely to experience migraine headaches:  Age. Risk increases with age.  Family history of migraine headaches.  Being Caucasian.  Depression and anxiety.  Obesity.  Being a woman.  Having a hole in the heart (patent foramen ovale) or other heart problems.  What are the signs or symptoms? The main symptom of this condition is pulsating or throbbing pain. Pain may:  Happen in any area of the head, such as on one side or both sides.  Interfere with daily activities.  Get worse with physical activity.  Get worse with exposure to bright lights or loud noises.  Other symptoms may include:  Nausea.  Vomiting.  Dizziness.  General sensitivity to bright lights, loud noises, or smells.  Before you get a migraine, you may get warning signs that a migraine is developing (aura). An aura may include:  Seeing flashing lights or having blind spots.  Seeing bright spots, halos, or zigzag lines.  Having tunnel vision or blurred vision.  Having numbness or a tingling feeling.  Having trouble talking.  Having muscle weakness.  How is this  diagnosed? A migraine headache can be diagnosed based on:  Your symptoms.  A physical exam.  Tests, such as CT scan or MRI of the head. These imaging tests can help rule out other causes of headaches.  Taking fluid from the spine (lumbar puncture) and analyzing it (cerebrospinal fluid analysis, or CSF analysis).  How is this treated? A migraine headache is usually treated with medicines that:  Relieve pain.  Relieve nausea.  Prevent migraines from coming back.  Treatment may also include:  Acupuncture.  Lifestyle changes like avoiding foods that trigger migraines.  Follow these instructions at home: Medicines  Take over-the-counter and prescription medicines only as told by your health care provider.  Do not drive or use heavy machinery while taking prescription pain medicine.  To prevent or treat constipation while you are taking prescription pain medicine, your health care provider may recommend that you: ? Drink enough fluid to keep your urine clear or pale yellow. ? Take over-the-counter or prescription medicines. ? Eat foods that are high in fiber, such as fresh fruits and vegetables, whole grains, and beans. ? Limit foods that are high in fat and processed sugars, such as fried and sweet foods. Lifestyle  Avoid alcohol use.  Do not use any products that contain nicotine or tobacco, such as cigarettes and e-cigarettes. If you need help quitting, ask your health care provider.  Get at least 8 hours of sleep every night.  Limit your stress. General instructions   Keep a journal to find out what may trigger your migraine headaches. For example, write down: ? What you eat and  drink. ? How much sleep you get. ? Any change to your diet or medicines.  If you have a migraine: ? Avoid things that make your symptoms worse, such as bright lights. ? It may help to lie down in a dark, quiet room. ? Do not drive or use heavy machinery. ? Ask your health care provider  what activities are safe for you while you are experiencing symptoms.  Keep all follow-up visits as told by your health care provider. This is important. Contact a health care provider if:  You develop symptoms that are different or more severe than your usual migraine symptoms. Get help right away if:  Your migraine becomes severe.  You have a fever.  You have a stiff neck.  You have vision loss.  Your muscles feel weak or like you cannot control them.  You start to lose your balance often.  You develop trouble walking.  You faint. This information is not intended to replace advice given to you by your health care provider. Make sure you discuss any questions you have with your health care provider. Document Released: 09/11/2005 Document Revised: 03/31/2016 Document Reviewed: 02/28/2016 Elsevier Interactive Patient Education  2017 Else[employer].   Sinusitis, Adult Sinusitis is soreness and inflammation of your sinuses. Sinuses are hollow spaces in the bones around your face. Your sinuses are located:  Around your eyes.  In the middle of your forehead.  Behind your nose.  In your cheekbones.  Your sinuses and nasal passages are lined with a stringy fluid (mucus). Mucus normally drains out of your sinuses. When your nasal tissues become inflamed or swollen, the mucus can become trapped or blocked so air cannot flow through your sinuses. This allows bacteria, viruses, and funguses to grow, which leads to infection. Sinusitis can develop quickly and last for 7?10 days (acute) or for more than 12 weeks (chronic). Sinusitis often develops after a cold. What are the causes? This condition is caused by anything that creates swelling in the sinuses or stops mucus from draining, including:  Allergies.  Asthma.  Bacterial or viral infection.  Abnormally shaped bones between the nasal passages.  Nasal growths that contain mucus (nasal polyps).  Narrow sinus  openings.  Pollutants, such as chemicals or irritants in the air.  A foreign object stuck in the nose.  A fungal infection. This is rare.  What increases the risk? The following factors may make you more likely to develop this condition:  Having allergies or asthma.  Having had a recent cold or respiratory tract infection.  Having structural deformities or blockages in your nose or sinuses.  Having a weak immune system.  Doing a lot of swimming or diving.  Overusing nasal sprays.  Smoking.  What are the signs or symptoms? The main symptoms of this condition are pain and a feeling of pressure around the affected sinuses. Other symptoms include:  Upper toothache.  Earache.  Headache.  Bad breath.  Decreased sense of smell and taste.  A cough that may get worse at night.  Fatigue.  Fever.  Thick drainage from your nose. The drainage is often green and it may contain pus (purulent).  Stuffy nose or congestion.  Postnasal drip. This is when extra mucus collects in the throat or back of the nose.  Swelling and warmth over the affected sinuses.  Sore throat.  Sensitivity to light.  How is this diagnosed? This condition is diagnosed based on symptoms, a medical history, and a physical exam. To find  out if your condition is acute or chronic, your health care provider may:  Look in your nose for signs of nasal polyps.  Tap over the affected sinus to check for signs of infection.  View the inside of your sinuses using an imaging device that has a light attached (endoscope).  If your health care provider suspects that you have chronic sinusitis, you may also:  Be tested for allergies.  Have a sample of mucus taken from your nose (nasal culture) and checked for bacteria.  Have a mucus sample examined to see if your sinusitis is related to an allergy.  If your sinusitis does not respond to treatment and it lasts longer than 8 weeks, you may have an MRI or CT  scan to check your sinuses. These scans also help to determine how severe your infection is. In rare cases, a bone biopsy may be done to rule out more serious types of fungal sinus disease. How is this treated? Treatment for sinusitis depends on the cause and whether your condition is chronic or acute. If a virus is causing your sinusitis, your symptoms will go away on their own within 10 days. You may be given medicines to relieve your symptoms, including:  Topical nasal decongestants. They shrink swollen nasal passages and let mucus drain from your sinuses.  Antihistamines. These drugs block inflammation that is triggered by allergies. This can help to ease swelling in your nose and sinuses.  Topical nasal corticosteroids. These are nasal sprays that ease inflammation and swelling in your nose and sinuses.  Nasal saline washes. These rinses can help to get rid of thick mucus in your nose.  If your condition is caused by bacteria, you will be given an antibiotic medicine. If your condition is caused by a fungus, you will be given an antifungal medicine. Surgery may be needed to correct underlying conditions, such as narrow nasal passages. Surgery may also be needed to remove polyps. Follow these instructions at home: Medicines  Take, use, or apply over-the-counter and prescription medicines only as told by your health care provider. These may include nasal sprays.  If you were prescribed an antibiotic medicine, take it as told by your health care provider. Do not stop taking the antibiotic even if you start to feel better. Hydrate and Humidify  Drink enough water to keep your urine clear or pale yellow. Staying hydrated will help to thin your mucus.  Use a cool mist humidifier to keep the humidity level in your home above 50%.  Inhale steam for 10-15 minutes, 3-4 times a day or as told by your health care provider. You can do this in the bathroom while a hot shower is running.  Limit  your exposure to cool or dry air. Rest  Rest as much as possible.  Sleep with your head raised (elevated).  Make sure to get enough sleep each night. General instructions  Apply a warm, moist washcloth to your face 3-4 times a day or as told by your health care provider. This will help with discomfort.  Wash your hands often with soap and water to reduce your exposure to viruses and other germs. If soap and water are not available, use hand sanitizer.  Do not smoke. Avoid being around people who are smoking (secondhand smoke).  Keep all follow-up visits as told by your health care provider. This is important. Contact a health care provider if:  You have a fever.  Your symptoms get worse.  Your symptoms do not  improve within 10 days. Get help right away if:  You have a severe headache.  You have persistent vomiting.  You have pain or swelling around your face or eyes.  You have vision problems.  You develop confusion.  Your neck is stiff.  You have trouble breathing. This information is not intended to replace advice given to you by your health care provider. Make sure you discuss any questions you have with your health care provider. Document Released: 09/11/2005 Document Revised: 05/07/2016 Document Reviewed: 07/07/2015 Elsevier Interactive Patient Education  [employer].  Please continue all medications as directed. Increase fluids/rest/vit c-2,033m/day Alternate Acetaminophen and Ibuprofen as needed for discomfort. If you need any Rx refills prior [place] please call clinic. FEEL BETTER!

## 2018-02-27 NOTE — Assessment & Plan Note (Signed)
Please continue all medications as directed. Increase fluids/rest/vit c-2,064m/day Alternate Acetaminophen and Ibuprofen as needed for discomfort. If you need any Rx refills prior to [place] please call clinic.

## 2018-02-27 NOTE — Assessment & Plan Note (Signed)
[place] Controlled Substance Database reviewed-no aberrancies noted. Tussionex as directed. Increase fluids

## 2018-02-27 NOTE — Progress Notes (Signed)
Subjective:    Patient ID: [name], female    DOB: Jul 19, 1976, 42 y.o.   MRN: [id number]   11/29/16 OV: Ms. Cantero presents to establish as a new pt.  She is a very pleasant healthy mother of 3 children (13 yr old and twin 2 yr olds).  PMH:  Migraine HA (4/10-9/10) that occurs weekly with mild sx's and 1-2 times month with severe sx's.  She reports aura and "spots in vision" at onset of migraine then pain, N/D, and extreme fatigue. She has been experiencing migraines for "decades, and my mother and sister also suffer from them as well".  She also had right hip pain that is intermittent 8/10 and began after twins were delivered in 2015.  She had PT that was quite successful in pain reduction and improved ROM.   Of note: she has normally lower BP SBP 90s, DBP 60-70s  02/27/18 OV: Ms. Guallpa presents for f/u: Migraine HA (continues to exp sx's 1-2 times/month) and has acute complaint of recurrent sinus drainage (thick/green).  She also has facial pressure, bil ear pain (3/10), non-productive cough, and increased fatigue- acute sx's present > 2 weeks. She has been pushing fluids and using Flonase. Recurrent sinusitis the last 12 months, she has seen ENT/Allergist and they did not offer additional treatment regime in addition to what she is currently taking- montelukast 23m QD, and flonase PRN She denies CP/dyspnea/palpitations/N/V/D She reports "stress eating bad" the last few weeks due to [place]  Patient Care Team    Relationship Specialty Notifications Start End  DEsaw Grandchild NP PCP - General Family Medicine  11/29/16   BJanyth Contes MD Consulting Physician Obstetrics and Gynecology  12/06/17     Patient Active Problem List   Diagnosis Date Noted  . Cough in adult 12/05/2017  . Sinusitis, acute maxillary 04/05/2017  . Pain in right hip 11/29/2016  . Health care maintenance 11/29/2016  . SVD (spontaneous vaginal delivery)  07/23/2014  . Breech extraction, delivered 07/23/2014  . Dichorionic diamniotic twin pregnancy in third trimester 07/22/2014  . GDM, class A1 07/22/2014  . Palpitations 04/15/2014  . Migraine with aura 02/16/2009  . ALLERGIC RHINITIS 02/16/2009  . PAP SMEAR, ABNORMAL 02/16/2009  . BACK STRAIN, LUMBAR 02/16/2009  . PERSONAL HISTORY OF ALLERGY TO PEANUTS 02/16/2009     Past Medical History:  Diagnosis Date  . Abnormal Pap smear   . AMA (advanced maternal age) multigravida 316+  . Breech extraction, delivered 07/23/2014  . Chronic headaches   . Dichorionic diamniotic twin pregnancy in third trimester 07/22/2014  . GDM, class A1 07/22/2014  . Gestational diabetes    diet controlled  . Heart murmur   . Palpitations   . Subfertility of couple   . SVD (spontaneous vaginal delivery) 04/19/2012  . SVD (spontaneous vaginal delivery) 07/23/2014  . Thrombocytopenia complicating pregnancy (HCC)    mild  . Vaginal Pap smear, abnormal      Past Surgical History:  Procedure Laterality Date  . COLPOSCOPY  2009  . HYSTEROSCOPY    . resection of polyp    . VAGINAL DELIVERY  07/23/2014   Procedure: VAGINAL DELIVERY;  Surgeon: JJanyth Contes MD;  [facility];  Service: Obstetrics;;  Twins  . WISDOM TOOTH EXTRACTION       Family History  Problem Relation Age of Onset  . Arthritis Mother   . Depression Mother   . Hypertension Mother   . Hypothyroidism Mother   .  Migraines Mother   . Miscarriages / [place] Mother   . Hypertension Sister   . Hypothyroidism Sister   . Migraines Sister   . Healthy Father   . Coronary artery disease Maternal Grandfather   . Arthritis Maternal Grandfather   . Heart disease Maternal Grandfather   . Hypertension Maternal Grandfather   . Stroke Maternal Grandfather   . Coronary artery disease Paternal Grandfather   . Arthritis Paternal Grandfather   . Heart disease Paternal Grandfather   . Hypertension Paternal Grandfather   . Stroke  Paternal Grandfather   . Cancer Maternal Aunt        breast and cervical  . Depression Maternal Uncle   . Migraines Maternal Grandmother   . Arthritis Maternal Grandmother   . Hypertension Maternal Grandmother   . Stroke Maternal Grandmother   . Arthritis Paternal Grandmother   . Hypertension Paternal Grandmother   . Stroke Paternal Grandmother   . Cancer Maternal Aunt        leaukemia     Social History   Substance and Sexual Activity  Drug Use No     Social History   Substance and Sexual Activity  Alcohol Use No     Social History   Tobacco Use  Smoking Status Former Smoker  . Packs/day: 0.25  . Years: 2.00  . Pack years: 0.50  . Last attempt to quit: 09/25/1998  . Years since quitting: 19.4  Smokeless Tobacco Never Used     Outpatient Encounter Medications as of 02/27/2018  Medication Sig  . Cholecalciferol (VITAMIN D3) 5000 units CAPS Take 1 capsule by mouth daily.  . fluticasone (FLONASE) 50 MCG/ACT nasal spray Place 2 sprays into both nostrils daily.  Marland Kitchen levonorgestrel (MIRENA) 20 MCG/24HR IUD 1 each by Intrauterine route once.  . montelukast (SINGULAIR) 10 MG tablet Take 1 tablet (10 mg total) by mouth at bedtime.  . ondansetron (ZOFRAN) 4 MG tablet TAKE 1 TABLET BY MOUTH EVERY 8 HOURS AS NEEDED FOR NAUSEA OR VOMITING.  Marland Kitchen oxymetazoline (AFRIN) 0.05 % nasal spray Place 1 spray into both nostrils 2 (two) times daily as needed for congestion.  . rizatriptan (MAXALT) 10 MG tablet Take one tablet by mouth as needed for migraine.  May repeat in 2 hours if needed.  . sertraline (ZOLOFT) 25 MG tablet Take 1 tablet (25 mg total) by mouth daily.  . [DISCONTINUED] fluticasone (FLONASE) 50 MCG/ACT nasal spray Place 2 sprays into both nostrils daily.  . [DISCONTINUED] montelukast (SINGULAIR) 10 MG tablet Take 1 tablet (10 mg total) by mouth at bedtime.  . [DISCONTINUED] ondansetron (ZOFRAN) 4 MG tablet TAKE 1 TABLET BY MOUTH EVERY 8 HOURS AS NEEDED FOR NAUSEA OR VOMITING.   . [DISCONTINUED] rizatriptan (MAXALT) 10 MG tablet Take one tablet by mouth as needed for migraine.  May repeat in 2 hours if needed.  PATIENT MUST HAVE OFFICE VISIT PRIOR TO ANY FURTHER REFILLS  . [DISCONTINUED] sertraline (ZOLOFT) 25 MG tablet Take 1 tablet by mouth daily.  Marland Kitchen amoxicillin-clavulanate (AUGMENTIN) 875-125 MG tablet Take 1 tablet by mouth 2 (two) times daily.  . chlorpheniramine-HYDROcodone (TUSSIONEX) 10-8 MG/5ML SUER Take 5 mLs by mouth every 12 (twelve) hours as needed for cough.  . [DISCONTINUED] acetaminophen (TYLENOL) 500 MG tablet Take 500-1,000 mg by mouth every 6 (six) hours as needed for mild pain or headache.  . [DISCONTINUED] amoxicillin-clavulanate (AUGMENTIN) 875-125 MG tablet Take 1 tablet by mouth 2 (two) times daily.  . [DISCONTINUED] chlorpheniramine-HYDROcodone (TUSSIONEX) 10-8 MG/5ML SUER Take 5  mLs by mouth every 12 (twelve) hours as needed for cough.   No facility-administered encounter medications on file as of 02/27/2018.     Allergies: Other; Sulfa antibiotics; Cetirizine hcl; Ofloxacin; and Sulfonamide derivatives  Body mass index is 30.29 kg/m.  Blood pressure 101/69, pulse 92, height 5' 7.5" (1.715 m), weight 196 lb 4.8 oz (89 kg), SpO2 96 %, unknown if currently breastfeeding.     Review of Systems  Constitutional: Positive for fatigue. Negative for activity change, appetite change, chills, diaphoresis, fever and unexpected weight change.  HENT: Positive for congestion, ear pain, facial swelling, postnasal drip, rhinorrhea, sinus pressure, sinus pain, sneezing and voice change.   Eyes: Negative for visual disturbance.  Respiratory: Positive for cough. Negative for chest tightness, shortness of breath and wheezing.   Cardiovascular: Negative for chest pain, palpitations and leg swelling.  Gastrointestinal: Negative for abdominal distention, abdominal pain, blood in stool, constipation, diarrhea, nausea and vomiting.  Endocrine: Negative for cold  intolerance, heat intolerance, polydipsia, polyphagia and polyuria.  Genitourinary: Negative for difficulty urinating and flank pain.  Musculoskeletal: Negative for arthralgias, back pain, gait problem, joint swelling, myalgias, neck pain and neck stiffness.  Skin: Negative for color change, pallor, rash and wound.  Neurological: Positive for headaches. Negative for dizziness, tremors, weakness, light-headedness and numbness.  Psychiatric/Behavioral: Negative for behavioral problems, confusion, decreased concentration, dysphoric mood, hallucinations, self-injury and suicidal ideas. The patient is not nervous/anxious and is not hyperactive.        Objective:   Physical Exam  Constitutional: She is oriented to person, place, and time. She appears well-developed and well-nourished. No distress.  HENT:  Head: Normocephalic and atraumatic.  Right Ear: External ear normal. Tympanic membrane is erythematous and bulging. No decreased hearing is noted.  Left Ear: External ear normal. Tympanic membrane is erythematous and bulging. No decreased hearing is noted.  Nose: Mucosal edema and rhinorrhea present. No nose lacerations. Right sinus exhibits maxillary sinus tenderness and frontal sinus tenderness. Left sinus exhibits maxillary sinus tenderness and frontal sinus tenderness.  Mouth/Throat: Uvula is midline and mucous membranes are normal. Posterior oropharyngeal edema and posterior oropharyngeal erythema present. No oropharyngeal exudate or tonsillar abscesses.  Eyes: Pupils are equal, round, and reactive to light. Conjunctivae are normal.  Neck: Normal range of motion. Neck supple.  Cardiovascular: Normal rate, regular rhythm, normal heart sounds and intact distal pulses.  No murmur heard. Pulmonary/Chest: Effort normal and breath sounds normal. No respiratory distress. She has no wheezes. She has no rales. She exhibits no tenderness.  Musculoskeletal: Normal range of motion.  Lymphadenopathy:     She has no cervical adenopathy.  Neurological: She is alert and oriented to person, place, and time.  Skin: Skin is warm and dry. No rash noted. She is not diaphoretic. No erythema. No pallor.  Psychiatric: She has a normal mood and affect. Her behavior is normal. Judgment and thought content normal.  Nursing note and vitals reviewed.     Assessment & Plan:   1. Acute non-recurrent maxillary sinusitis   2. Health care maintenance   3. Cough in adult     Health care maintenance Please continue all medications as directed. Increase fluids/rest/vit c-2,033m/day Alternate Acetaminophen and Ibuprofen as needed for discomfort. If you need any Rx refills prior to [place] please call clinic.  Sinusitis, acute maxillary Please continue all medications as directed. Increase fluids/rest/vit c-2,0023mday Alternate Acetaminophen and Ibuprofen as needed for discomfort.   [place]  [place]ed. Tussionex as directed. Increase fluids     FOLLOW-UP:  Return in about 6 months (around 08/29/2018) for CPE.

## 2018-02-27 NOTE — Assessment & Plan Note (Signed)
Please continue all medications as directed. Increase fluids/rest/vit c-2,044m/day Alternate Acetaminophen and Ibuprofen as needed for discomfort.

## 2018-03-16 ENCOUNTER — Other Ambulatory Visit: Payer: Self-pay | Admitting: Adult Health

## 2018-07-02 ENCOUNTER — Other Ambulatory Visit: Payer: Self-pay | Admitting: Adult Health

## 2018-10-24 ENCOUNTER — Other Ambulatory Visit: Payer: Self-pay | Admitting: Adult Health

## 2018-10-28 ENCOUNTER — Telehealth: Payer: Self-pay

## 2018-10-28 NOTE — Telephone Encounter (Signed)
Please call pt to schedule CPE.  PATIENT MUST HAVE OFFICE VISIT PRIOR TO ANY FURTHER REFILLS.  Charyl Bigger, CMA

## 2018-10-29 ENCOUNTER — Telehealth: Payer: Self-pay | Admitting: Adult Health

## 2018-10-29 NOTE — Telephone Encounter (Signed)
Per patient they have moved to [place] and no longer live in [place] message to medical assistant as FYI--glh

## 2018-10-29 NOTE — Telephone Encounter (Signed)
Tracey Harries, CMA
